# Patient Record
Sex: Female | Born: 1960 | Race: White | Hispanic: No | Marital: Married | Smoking: Never smoker
Health system: Southern US, Community
[De-identification: ages and names within clinical notes are randomized; demographics above are authoritative.]

## PROBLEM LIST (undated history)

## (undated) ENCOUNTER — Emergency Department (HOSPITAL_COMMUNITY): Admission: EM | Payer: PRIVATE HEALTH INSURANCE | Source: Home / Self Care

## (undated) DIAGNOSIS — Z5189 Encounter for other specified aftercare: Secondary | ICD-10-CM

## (undated) DIAGNOSIS — D649 Anemia, unspecified: Secondary | ICD-10-CM

## (undated) DIAGNOSIS — IMO0001 Reserved for inherently not codable concepts without codable children: Secondary | ICD-10-CM

## (undated) DIAGNOSIS — F329 Major depressive disorder, single episode, unspecified: Secondary | ICD-10-CM

## (undated) DIAGNOSIS — F419 Anxiety disorder, unspecified: Secondary | ICD-10-CM

## (undated) DIAGNOSIS — F32A Depression, unspecified: Secondary | ICD-10-CM

## (undated) HISTORY — PX: BREAST BIOPSY: SHX20

---

## 2009-10-20 ENCOUNTER — Other Ambulatory Visit: Admission: RE | Admit: 2009-10-20 | Discharge: 2009-10-20 | Payer: Self-pay | Admitting: Family Medicine

## 2009-11-03 ENCOUNTER — Encounter: Admission: RE | Admit: 2009-11-03 | Discharge: 2009-11-03 | Payer: Self-pay | Admitting: Family Medicine

## 2011-01-01 ENCOUNTER — Emergency Department (HOSPITAL_COMMUNITY)
Admission: EM | Admit: 2011-01-01 | Discharge: 2011-01-02 | Disposition: A | Discharge status: Other Acute Inpatient Hospital | Payer: 59 | Attending: Emergency Medicine | Admitting: Emergency Medicine

## 2011-01-01 DIAGNOSIS — R45851 Suicidal ideations: Secondary | ICD-10-CM | POA: Insufficient documentation

## 2011-01-01 DIAGNOSIS — F329 Major depressive disorder, single episode, unspecified: Secondary | ICD-10-CM | POA: Insufficient documentation

## 2011-01-01 DIAGNOSIS — F3289 Other specified depressive episodes: Secondary | ICD-10-CM | POA: Insufficient documentation

## 2011-01-01 LAB — RAPID URINE DRUG SCREEN, HOSP PERFORMED
Barbiturates: NOT DETECTED
Benzodiazepines: NOT DETECTED
Cocaine: NOT DETECTED
Tetrahydrocannabinol: NOT DETECTED

## 2011-01-01 LAB — DIFFERENTIAL
Basophils Absolute: 0.1 10*3/uL (ref 0.0–0.1)
Basophils Relative: 1 % (ref 0–1)
Eosinophils Absolute: 0.1 10*3/uL (ref 0.0–0.7)
Eosinophils Relative: 1 % (ref 0–5)
Lymphocytes Relative: 15 % (ref 12–46)
Lymphs Abs: 0.8 10*3/uL (ref 0.7–4.0)
Neutrophils Relative %: 73 % (ref 43–77)

## 2011-01-01 LAB — COMPREHENSIVE METABOLIC PANEL
Albumin: 4.2 g/dL (ref 3.5–5.2)
Alkaline Phosphatase: 62 U/L (ref 39–117)
BUN: 16 mg/dL (ref 6–23)
Calcium: 10.4 mg/dL (ref 8.4–10.5)
Potassium: 3.5 mEq/L (ref 3.5–5.1)
Sodium: 140 mEq/L (ref 135–145)
Total Protein: 8.1 g/dL (ref 6.0–8.3)

## 2011-01-01 LAB — CBC
HCT: 39.5 % (ref 36.0–46.0)
MCHC: 31.6 g/dL (ref 30.0–36.0)
MCV: 88 fL (ref 78.0–100.0)
RDW: 13.1 % (ref 11.5–15.5)

## 2011-01-02 ENCOUNTER — Inpatient Hospital Stay (HOSPITAL_COMMUNITY)
Admission: AD | Admit: 2011-01-02 | Discharge: 2011-01-23 | DRG: 885 | Disposition: A | Discharge status: Home / Self Care | Payer: PRIVATE HEALTH INSURANCE | Source: Ambulatory Visit | Attending: Psychiatry | Admitting: Psychiatry

## 2011-01-02 DIAGNOSIS — R109 Unspecified abdominal pain: Secondary | ICD-10-CM

## 2011-01-02 DIAGNOSIS — R209 Unspecified disturbances of skin sensation: Secondary | ICD-10-CM

## 2011-01-02 DIAGNOSIS — R5381 Other malaise: Secondary | ICD-10-CM

## 2011-01-02 DIAGNOSIS — R45851 Suicidal ideations: Secondary | ICD-10-CM

## 2011-01-02 DIAGNOSIS — F3160 Bipolar disorder, current episode mixed, unspecified: Principal | ICD-10-CM

## 2011-01-02 DIAGNOSIS — Z880 Allergy status to penicillin: Secondary | ICD-10-CM

## 2011-01-02 DIAGNOSIS — G4700 Insomnia, unspecified: Secondary | ICD-10-CM

## 2011-01-02 DIAGNOSIS — Z818 Family history of other mental and behavioral disorders: Secondary | ICD-10-CM

## 2011-01-02 DIAGNOSIS — Z560 Unemployment, unspecified: Secondary | ICD-10-CM

## 2011-01-02 DIAGNOSIS — R5383 Other fatigue: Secondary | ICD-10-CM

## 2011-01-03 DIAGNOSIS — F312 Bipolar disorder, current episode manic severe with psychotic features: Secondary | ICD-10-CM

## 2011-01-04 LAB — VITAMIN B12: Vitamin B-12: 1249 pg/mL — ABNORMAL HIGH (ref 211–911)

## 2011-01-04 NOTE — H&P (Signed)
NAME:  Chloe Dennis, Chloe Dennis NO.:  [id number]  MEDICAL RECORD NO.:  [id number]           PATIENT TYPE:  I  LOCATION:                                FACILITY:  [facility]  PHYSICIAN:  Eulogio Ditch, MD DATE OF BIRTH:  08-Jun-1961  DATE OF ADMISSION:  01/02/2011 DATE OF DISCHARGE:                      PSYCHIATRIC ADMISSION ASSESSMENT   IDENTIFYING INFORMATION:  This is a 50 year old female voluntarily admitted on January 02, 2011.  HISTORY OF PRESENT ILLNESS:  The patient reports symptoms of insomnia, experiencing this for months.  Her medications have not been working. She states that she is unable to think, that she is feeling numb physically.  She had a loss of appetite to about 15 pounds.  She states that she has been irritable and has been yelling at her dog.  She denies any psychotic symptoms.  Denies any substance use, feeling depressed and had some passive suicidal thoughts.  The patient denies any significant stressors.  PAST PSYCHIATRIC HISTORY:  First admission to the [facility].  She was hospitalized in [facility] back in 2011 for depression.  She has a history of a suicide attempt in October 2011 by overdosing.  She sees Retail buyer at [employer] in [place].  SOCIAL HISTORY:  The patient is married.  She is unemployed.  She lives with her husband.  She has a 57 year old son.  The patient is one of six sisters.  FAMILY HISTORY:  Mother she reports had a history of pregnancy psychosis.  Her older sister has a history of schizophrenia and is currently in assisted living.  ALCOHOL/DRUG HISTORY:  She denies any alcohol or substance use.  Primary care provider is Dr. Hyacinth Meeker.  MEDICAL PROBLEMS:  Denies any acute or chronic health issues.  MEDICATIONS:  Has been on Seroquel XR 600 mg, which she states is not effective.  In the past has been on Klonopin, Restoril, Remeron, lithium and Abilify.  DRUG ALLERGIES:  Penicillin with a side  effect with hives.  PHYSICAL EXAM:  GENERAL APPEARANCE:  The patient was assessed in the emergency department.  Physical exam was reviewed.  The patient was noted to be tachycardic and uncomfortable.  Physical exam overall, however, was no significant findings.  LABORATORY DATA:  Her CBC is within normal limits.  Her urine drug screen is negative.  Her CMP is negative.  Alcohol level was less than 5.  MENTAL STATUS EXAM:  The patient is fully alert and cooperative with fair eye contact.  She is casually dressed.  Her speech is clear, normal pace and tone.  Provides a fairly good history of her symptoms that prompted the admission.  She feels she needs to be here and asking for any kind of help that can get her sleeping.  Her thought processes overall are coherent and goal directed.  No evidence of any psychotic symptoms and does not appear internally distracted or guarded. Cognitive function intact.  She is aware of herself, place and situation.  ASSESSMENT:  AXIS I:  Bipolar disorder, depressed. AXIS II:  Deferred. AXIS III:  No known medical conditions. AXIS IV:  Other psychosocial problems  related to burden of illness. AXIS V:  Current is 30, 35.  PLAN:  Our plan is to discontinue the Seroquel.  We will have Ambien available for sleep as the patient reports that she slept in the emergency department on Ambien.  We will continue to address other comorbidities.  We will contact husband for concerns and safety issues. We will have nutritional supplements available and check further labs, check a TSH and B12 level.  Her tentative length of stay at this time is 3-4 days.     Landry Corporal, N.P.   ______________________________ Eulogio Ditch, MD    JO/MEDQ  D:  01/03/2011  T:  01/03/2011  Job:  [id number]  cc:   Eulogio Ditch, MD  Electronically Signed by Limmie PatriciaP. on 01/03/2011 01:13:13 PM Electronically Signed by Eulogio Ditch  on 01/04/2011  07:15:54 AM

## 2011-01-10 ENCOUNTER — Ambulatory Visit (HOSPITAL_COMMUNITY): Payer: 59 | Attending: Psychiatry

## 2011-01-10 DIAGNOSIS — R110 Nausea: Secondary | ICD-10-CM | POA: Insufficient documentation

## 2011-01-10 DIAGNOSIS — R141 Gas pain: Secondary | ICD-10-CM | POA: Insufficient documentation

## 2011-01-10 DIAGNOSIS — R142 Eructation: Secondary | ICD-10-CM | POA: Insufficient documentation

## 2011-01-10 DIAGNOSIS — K5900 Constipation, unspecified: Secondary | ICD-10-CM | POA: Insufficient documentation

## 2011-01-10 DIAGNOSIS — R109 Unspecified abdominal pain: Secondary | ICD-10-CM | POA: Insufficient documentation

## 2011-01-10 LAB — COMPREHENSIVE METABOLIC PANEL
ALT: 26 U/L (ref 0–35)
Alkaline Phosphatase: 63 U/L (ref 39–117)
CO2: 26 mEq/L (ref 19–32)
Chloride: 102 mEq/L (ref 96–112)
GFR calc Af Amer: >60 mL/min (ref >60)
Sodium: 138 mEq/L (ref 135–145)
Total Bilirubin: 0.3 mg/dL (ref 0.3–1.2)

## 2011-01-10 LAB — LIPASE, BLOOD: Lipase: 34 U/L (ref 11–59)

## 2011-01-12 LAB — URINALYSIS, ROUTINE W REFLEX MICROSCOPIC
Glucose, UA: NEGATIVE mg/dL
Ketones, ur: NEGATIVE mg/dL
Protein, ur: NEGATIVE mg/dL
Urobilinogen, UA: 0.2 mg/dL (ref 0.0–1.0)

## 2011-01-16 LAB — PREGNANCY, URINE: Preg Test, Ur: NEGATIVE

## 2011-01-18 ENCOUNTER — Ambulatory Visit (HOSPITAL_COMMUNITY): DRG: 948 | Payer: 59 | Attending: Psychiatry

## 2011-01-18 DIAGNOSIS — R4182 Altered mental status, unspecified: Secondary | ICD-10-CM | POA: Diagnosis present

## 2011-01-18 DIAGNOSIS — F319 Bipolar disorder, unspecified: Secondary | ICD-10-CM | POA: Diagnosis present

## 2011-01-18 LAB — SEDIMENTATION RATE: Sed Rate: 33 mm/hr — ABNORMAL HIGH (ref 0–22)

## 2011-01-18 MED ORDER — GADOBENATE DIMEGLUMINE 529 MG/ML IV SOLN
10.0000 mL | Freq: Once | INTRAVENOUS | Status: AC | PRN
  Administered 2011-01-18: 10 mL via INTRAVENOUS

## 2011-01-19 LAB — ANTI-NUCLEAR AB-TITER (ANA TITER): ANA Titer 1: 1:80 {titer} — ABNORMAL HIGH

## 2011-01-19 LAB — C-REACTIVE PROTEIN: CRP: 0.5 mg/dL — ABNORMAL LOW (ref <0.6)

## 2011-01-19 NOTE — Consult Note (Signed)
NAME:  Chloe Dennis, Chloe Dennis NO.:  [id number]  MEDICAL RECORD NO.:  [id number]           PATIENT TYPE:  I  LOCATION:  0505                          FACILITY:  [facility]  PHYSICIAN:  Andreas Blower, MD       DATE OF BIRTH:  1961/09/20  DATE OF CONSULTATION: DATE OF DISCHARGE:                                CONSULTATION   REFERRING PHYSICIAN:  Marlis Edelson, DO.  CLINICAL QUESTION TO BE ANSWERED:  Generalized numbness and weakness.  HISTORY OF PRESENT ILLNESS:  [name] is a 50 year old Caucasian female with a possible history of bipolar disorder, depression, anxiety, insomnia who was admitted on January 02, 2011 for insomnia.  The patient reports that she has not been able to sleep for months prior to admission and has complained about being numb both emotionally as well as physically for few months.  She has lost about 15 pounds and was admitted to [facility] on January 02, 2011.  During the course of the hospital stay, the patient complained about abdominal pain which was thought to be secondary to constipation.  The patient was started on stool softeners and her symptoms improved.  During the course of the hospital stay, the patient continued to complain about generalized numbness.  As a result, the Hospitalist Service was consulted to help with the workup. She reports that since October 2012, she has reported that generalized numbness initially started in her legs and arms which then progressed to all over her body.  She reports she does not feel pain with injury, but feels pressure like senstation.  She denies any recent fevers or chills.  Did report that she used to have headaches. Currently denies any headaches. Denies any vision changes.  Denies any chest pain or shortness of breath.  Denies any abdominal pain.  REVIEW OF SYSTEMS:  All systems were reviewed with the patient and were positive as per HPI, otherwise all other systems are negative.  She reports  that she is anxious and because of anxiety, she often has nausea.  SOCIAL HISTORY:  The patient does not smoke, does not drink any alcohol, is not working currently.  FAMILY HISTORY:  Significant for father having stents put in.  She reports that her father had a stent in his aorta as well as his arms. Does not know if her father had heart problems.  Mother died from lung cancer, also had a pulmonary embolism.  Has an elder sister with schizophrenia, has a younger sister with bipolar disorder which is medically-managed.  PHYSICAL EXAM:  VITALS:  Temperature is 99.4, respiration 18, blood pressure is 135/101, pulse of 97. GENERAL:  The patient was awake, oriented and was lying in bed comfortably.  Appeared anxious. HEENT:  Extraocular motions are intact.  Pupils equal, round, slightly dilated, had moist mucous membranes NECK:  Supple. HEART:  Was regular with S1, S2. LUNGS:  Clear to auscultation bilaterally. ABDOMEN:  Soft, nontender, nondistended.  Positive bowel sounds. EXTREMITIES:  Patient good peripheral pulses with trace edema. NEURO:  Cranial nerves II-XII grossly intact.  Had 5/5 motor strength in upper as well as  lower extremities.  Finger-to-nose was intact bilaterally. Reports pressure like sensation to my touch.  RADIOLOGY/IMAGING:  MRI of brain per radiology was negative.  LABS:  CBC shows a white count of 5.4, hemoglobin 12.5, hematocrit 36.5, platelet count 320 and these labs were done on January 15, 2011. Electrolytes normal with a creatinine of 1.08.  Most recent labs yesterday showed a ESR of 33.  Folate is 10.7.  Urine pregnancy was negative.  CRP was 0.5.  ANA was positive.  ANA titer was 1:80 with a nucleolar pattern.  ASSESSMENT/PLAN: 1. Numbness and generalized weakness, etiology unclear.  The MRI is     done; however, I do not have the results available, but reports     by radiology as being negative. The patient's ANA is positive     but uncertain of  etiology of this nonspecific finding.  Her ESR     is mildly elevated but her CRP was low.  Her neurologic exam     does not suggest that the patient has generalized numbness as she     has intact proprioception with intact finger-to-nose.  Could     consider getting Neurology evaluation. Uncertain if some of her     medications, anxiety and insomnia are causing some of her     generalized numbness and weakness. 2. Anxiety and depression.  The patient is on medications that were     observed by Psychiatry, continue management as per Psychiatry. 3. Questionable history of bipolar disorder.  Continue medications per     Psychiatry. 4. Insomnia.  Again, management as per Psychiatry. 5. Recent abdominal pain secondary to constipation, resolved. 6. History of hypertension, resolved after losing weight.  Blood     pressure stable at this time, not on any hypertensives. 7. Antinuclear antibody is positive, a nonspecific finding.  Uncertain     if the patient has CNS vasculitis.  Her CRP is negative.  Her ESR     is mildly elevated.  We will send for ANCA for further evaluation.     Based on ANCA and the MRI results, could consider Neurology workup.  Thanks for the consult.  We will continue to follow.   Andreas Blower, MD   SR/MEDQ  D:  01/19/2011  T:  01/19/2011  Job:  [id number]  Electronically Signed by Wardell Heath Carolanne Mercier  on 01/19/2011 05:37:06 PM

## 2011-01-20 LAB — COMPREHENSIVE METABOLIC PANEL
ALT: 12 U/L (ref 0–35)
AST: 16 U/L (ref 0–37)
Alkaline Phosphatase: 62 U/L (ref 39–117)
CO2: 24 mEq/L (ref 19–32)
Calcium: 9.9 mg/dL (ref 8.4–10.5)
Potassium: 3.6 mEq/L (ref 3.5–5.1)
Sodium: 139 mEq/L (ref 135–145)
Total Protein: 7.3 g/dL (ref 6.0–8.3)

## 2011-01-20 LAB — CBC
Hemoglobin: 11.8 g/dL — ABNORMAL LOW (ref 12.0–15.0)
RBC: 4.23 MIL/uL (ref 3.87–5.11)

## 2011-01-21 DIAGNOSIS — F312 Bipolar disorder, current episode manic severe with psychotic features: Secondary | ICD-10-CM

## 2011-01-23 LAB — ANTI-NEUTROPHIL ANTIBODY

## 2011-01-23 NOTE — Consult Note (Signed)
NAME:  Chloe Dennis, Chloe Dennis NO.:  [id number]  MEDICAL RECORD NO.:  [id number]           PATIENT TYPE:  I  LOCATION:  0505                          FACILITY:  [facility]  PHYSICIAN:  Thana Farr, MD    DATE OF BIRTH:  Apr 22, 1961  DATE OF CONSULTATION:  01/22/2011 DATE OF DISCHARGE:                                CONSULTATION   HISTORY:  Chloe Dennis is a 50 year old female that was admitted to the [facility] for anxiety and depression.  The patient reports that for the past year, she has had progressive symptoms of numbness all over her body.  She reports that from her head to her toes, she feels as if she is numb.  This is on both sides of her body.  She reports she can feel the pressure of somebody touching her, but does not feel the sensation.  She has not had any bowel or bladder incontinence. She does not report any muscle weakness.  PAST MEDICAL HISTORY:  Anxiety, depression.  MEDICATIONS PRIOR TO ADMISSION:  Seroquel.  SOCIAL HISTORY:  The patient has no history of alcohol, tobacco, or illicit drug abuse.  She has not worked in 3 years.  PHYSICAL EXAMINATION:  VITAL SIGNS:  Blood pressure 129/83, heart rate 94, respiratory rate 68, temperature 99.7.  MENTAL STATUS TESTING:  The patient is alert and oriented.  She can follow commands without difficulty.  Speech is fluent.  On cranial nerve testing, II--disks flat bilaterally.  Visual fields grossly intact.  III, IV, VI--extraocular movements intact.  V and VII--smile is symmetric.  VIII--grossly intact. IX and X--positive gag.  XI--bilateral shoulder shrug.  XII--midline tongue extension.  On motor exam, the patient is 5/5 throughout.  There is no drift.  There is normal tone and bulk.  On sensory testing, the patient reports that she has an unusual sensation throughout her entire body as I touch her. Vibratory sense is intact throughout.  Deep tendon reflexes are 2+ throughout.  Plantars are mute  bilaterally.  On cerebellar testing, finger-to-nose and heel-to-shin intact.  Gait normal.  ASSESSMENT:  Ms. Reder is a 50 year old female that presents with paresthesias throughout.  There is no focal nature to her symptoms. There are no other associated symptoms.  Her examination is nonfocal. She has had an MRI of the brain and this has been reviewed.  It is unremarkable.  B12 has been checked and it is over 1200.  TSH is normal as well.  Erythrocyte sedimentation rate is 33 and ANA is positive.  At this point, there is no reason to believe that there is any neurologic basis for her symptoms.  Although her ANA is positive, it is not the cause of her paresthesias.  I do suspect that her paresthesias are secondary to her anxiety.  PLAN:  No further neurologic workup recommended at this time.  Agree with continued psychiatric support.  Neurology will sign off on this case at this time.  Please feel free to call if any further neurological input is required.          ______________________________ Thana Farr, MD  LR/MEDQ  D:  01/22/2011  T:  01/23/2011  Job:  [id number]  Electronically Signed by Thana Farr MD on 01/23/2011 06:47:27 PM

## 2011-02-10 NOTE — Discharge Summary (Signed)
NAME:  Chloe Dennis, Chloe Dennis NO.:  [id number]  MEDICAL RECORD NO.:  [id number]           PATIENT TYPE:  I  LOCATION:  0505                          FACILITY:  [facility]  PHYSICIAN:  Eulogio Ditch, MD DATE OF BIRTH:  1961-01-01  DATE OF ADMISSION:  01/02/2011 DATE OF DISCHARGE:  01/23/2011                              DISCHARGE SUMMARY   REASON FOR ADMISSION:  This is a 50 year old female who was endorsing suicidal thoughts, reporting decreased sleep for months, feeling like she could not think, feeling very numb physically, has lost about 15 pounds.  FINAL DIAGNOSES:  Axis I:  Bipolar disorder, depressed. Axis II:  Deferred. Axis III:  No known medical conditions. Axis IV:  Psychosocial problems related to burden of illness. Axis V:  50.  SIGNIFICANT LABS:  Her CBC was within normal limits.  Urine drug screen was negative.  CMP was negative.  Alcohol level was less than 5.  SIGNIFICANT FINDINGS:  This is a fully alert and cooperative female. Fair eye contact.  Casually dressed.  Speech is clear with normal pace and tone.  She provides a good history of her symptoms that prompted her admission.  She feels that she needs to be here and is asking for any kind of help that she can get to help her sleep.  Her thought processes overall are coherent and goal-directed.  She does not appear to be internally distracted or guarded and is oriented to person, time and place.  We discontinued her Seroquel and had Ambien available for sleep, as she stated that she slept well in the emergency department on Ambien. We continued to address her other comorbidities and contacted her husband for concerns and safety issues.  We obtained a TSH and B12 level.  We discontinued her Risperdal and started Zyprexa and added Prozac daily.  Patient reporting sleeping only 1-1/2 hours.  We contacted the patient's husband for information and to provide education.  The patient had to be placed on  a one-to-one, as she was reporting suicidal ideation and forming plans and being unable to contract for safety.  She was feeling very hopeless.  She remained cooperative but continued with her one-to-one.  Endorsing symptoms of feeling numb and anxious and not being able to sleep at great length. She denied any suicidal or homicidal thoughts, reporting symptoms of anxiety and stating that she had slept well for the first time but continuing to endorse symptoms of feeling numb both emotionally and physically.  She was taking care of her hygiene, showering, brushing her teeth.  Her appetite was improving some.  Her affect remained blunted to labile.  She denied any auditory hallucinations.  We added Remeron for sleep.  She was making some improvement, less anxious, but having suicidal thoughts without a specific plan, having a lot of somatic complaints like numbness in the face and in her arms.  She was, however, logical and goal-directed.  We had Internal Medicine come to do a consult on the patient with patient's continuing complaints of numbness and with her ongoing somatic complaints.  We continued with her Seroquel and Prozac and  Remeron and trazodone and Ambien for sleep.  The patient continued with ongoing symptoms of anxiety.  She was having a lot of negative thinking, always feeling like something was wrong and endorsing symptoms of insomnia.  The patient continued to endorse, again, that she was not sleeping.  We were having nursing as well monitoring the patient's insomnia.  We had done further lab work.  We discontinued her Restoril and added Sonata for insomnia and Lamictal for mood stability. She had moments when she would get very tearful and anxious, talking about feeling weaker and not sleeping.  We did a neurological consult, as the patient had a positive ANA.  Her MRI of the brain was unremarkable and coupled with the patient's physical complaints, they felt that her  symptoms were likely related to anxiety.  We contacted the patient's husband to discuss discharge safety concerns.  The patient had improved to the point where she was safe and denied any suicidal thoughts.  We contacted her husband who was agreeable to take the patient home.  We had gotten an appointment with her primary care provider for further recommendations, and the patient was discharged to home.  DISCHARGE MEDICATIONS: 1. Colace 100 mg by mouth daily. 2. Lamictal 25 mg 1 tablet daily. 3. MiraLax 17 grams daily. 4. Ambien 10 mg q.h.s. for sleep. 5. The patient was to stop taking her Seroquel.  FOLLOWUP APPOINTMENTS: 1. With Anne Fu on Monday, April 9th at 11:20. 2. She had an appointment with her primary care provider in April 5th     at 11:00 a.m.  Phone number [phone], Dr. Sigmund Hazel.     Landry Corporal, N.P.   ______________________________ Eulogio Ditch, MD    JO/MEDQ  D:  02/07/2011  T:  02/07/2011  Job:  [id number]  Electronically Signed by Limmie PatriciaP. on 02/08/2011 10:53:54 AM Electronically Signed by Eulogio Ditch  on 02/10/2011 05:37:09 AM

## 2011-02-16 ENCOUNTER — Other Ambulatory Visit (HOSPITAL_COMMUNITY): Payer: Self-pay | Admitting: Unknown Physician Specialty

## 2011-02-16 DIAGNOSIS — M542 Cervicalgia: Secondary | ICD-10-CM

## 2011-02-21 ENCOUNTER — Ambulatory Visit (HOSPITAL_COMMUNITY)
Admission: RE | Admit: 2011-02-21 | Discharge: 2011-02-21 | Disposition: A | Discharge status: Home / Self Care | Payer: 59 | Source: Ambulatory Visit | Attending: Unknown Physician Specialty | Admitting: Unknown Physician Specialty

## 2011-02-21 DIAGNOSIS — R209 Unspecified disturbances of skin sensation: Secondary | ICD-10-CM | POA: Insufficient documentation

## 2011-02-21 DIAGNOSIS — E049 Nontoxic goiter, unspecified: Secondary | ICD-10-CM | POA: Insufficient documentation

## 2011-02-21 DIAGNOSIS — M542 Cervicalgia: Secondary | ICD-10-CM

## 2011-02-21 DIAGNOSIS — M47812 Spondylosis without myelopathy or radiculopathy, cervical region: Secondary | ICD-10-CM | POA: Insufficient documentation

## 2011-02-21 DIAGNOSIS — M79609 Pain in unspecified limb: Secondary | ICD-10-CM | POA: Insufficient documentation

## 2011-02-27 ENCOUNTER — Ambulatory Visit: Payer: 59 | Admitting: Hematology & Oncology

## 2011-03-13 ENCOUNTER — Other Ambulatory Visit: Payer: Self-pay | Admitting: Family Medicine

## 2011-03-13 DIAGNOSIS — E041 Nontoxic single thyroid nodule: Secondary | ICD-10-CM

## 2011-03-15 ENCOUNTER — Ambulatory Visit
Admission: RE | Admit: 2011-03-15 | Discharge: 2011-03-15 | Disposition: A | Discharge status: Home / Self Care | Payer: 59 | Source: Ambulatory Visit | Attending: Family Medicine | Admitting: Family Medicine

## 2011-03-15 DIAGNOSIS — E041 Nontoxic single thyroid nodule: Secondary | ICD-10-CM

## 2011-03-16 ENCOUNTER — Other Ambulatory Visit: Payer: Self-pay | Admitting: Hematology & Oncology

## 2011-03-16 ENCOUNTER — Ambulatory Visit (HOSPITAL_BASED_OUTPATIENT_CLINIC_OR_DEPARTMENT_OTHER): Payer: 59 | Admitting: Hematology & Oncology

## 2011-03-16 DIAGNOSIS — D649 Anemia, unspecified: Secondary | ICD-10-CM

## 2011-03-16 DIAGNOSIS — R634 Abnormal weight loss: Secondary | ICD-10-CM

## 2011-03-16 DIAGNOSIS — R209 Unspecified disturbances of skin sensation: Secondary | ICD-10-CM

## 2011-03-16 LAB — CBC WITH DIFFERENTIAL (CANCER CENTER ONLY)
BASO#: 0 10*3/uL (ref 0.0–0.2)
Eosinophils Absolute: 0.2 10*3/uL (ref 0.0–0.5)
HGB: 11.5 g/dL — ABNORMAL LOW (ref 11.6–15.9)
MCH: 27.7 pg (ref 26.0–34.0)
MCV: 84 fL (ref 81–101)
MONO#: 0.6 10*3/uL (ref 0.1–0.9)
MONO%: 8.9 % (ref 0.0–13.0)
NEUT#: 4.3 10*3/uL (ref 1.5–6.5)
RBC: 4.15 10*6/uL (ref 3.70–5.32)
WBC: 6.3 10*3/uL (ref 3.9–10.0)

## 2011-03-16 LAB — CERULOPLASMIN: Ceruloplasmin: 33 mg/dL (ref 21–63)

## 2011-03-20 ENCOUNTER — Other Ambulatory Visit: Payer: Self-pay | Admitting: Hematology & Oncology

## 2011-03-21 LAB — IRON AND TIBC
%SAT: 9 % — ABNORMAL LOW (ref 20–55)
TIBC: 388 ug/dL (ref 250–470)

## 2011-03-21 LAB — PROTEIN ELECTROPHORESIS, SERUM
Beta Globulin: 6.6 % (ref 4.7–7.2)
Total Protein, Serum Electrophoresis: 7.1 g/dL (ref 6.0–8.3)

## 2011-03-21 LAB — VITAMIN D 25 HYDROXY (VIT D DEFICIENCY, FRACTURES): Vit D, 25-Hydroxy: 36 ng/mL (ref 30–89)

## 2011-03-21 LAB — LACTATE DEHYDROGENASE: LDH: 137 U/L (ref 94–250)

## 2011-03-21 LAB — VITAMIN B12: Vitamin B-12: 1779 pg/mL — ABNORMAL HIGH (ref 211–911)

## 2011-03-21 LAB — FERRITIN: Ferritin: 6 ng/mL — ABNORMAL LOW (ref 10–291)

## 2011-03-23 ENCOUNTER — Other Ambulatory Visit: Payer: Self-pay | Admitting: Family Medicine

## 2011-03-23 DIAGNOSIS — E042 Nontoxic multinodular goiter: Secondary | ICD-10-CM

## 2011-03-23 LAB — UIFE/LIGHT CHAINS/TP QN, 24-HR UR
Albumin, U: DETECTED
Free Lambda Lt Chains,Ur: <0.04 mg/dL — ABNORMAL LOW (ref 0.08–1.01)
Free Lt Chn Excr Rate: 10.59 mg/d
Total Protein, Urine-Ur/day: 17 mg/d (ref 10–140)
Total Protein, Urine: 1.2 mg/dL
Volume, Urine: 1450 mL

## 2011-04-03 ENCOUNTER — Encounter (HOSPITAL_BASED_OUTPATIENT_CLINIC_OR_DEPARTMENT_OTHER): Payer: 59 | Admitting: Hematology & Oncology

## 2011-04-03 DIAGNOSIS — D649 Anemia, unspecified: Secondary | ICD-10-CM

## 2011-04-10 ENCOUNTER — Other Ambulatory Visit: Payer: Self-pay | Admitting: Interventional Radiology

## 2011-04-10 ENCOUNTER — Other Ambulatory Visit (HOSPITAL_COMMUNITY)
Admission: RE | Admit: 2011-04-10 | Discharge: 2011-04-10 | Disposition: A | Discharge status: Home / Self Care | Payer: 59 | Source: Ambulatory Visit | Attending: Interventional Radiology | Admitting: Interventional Radiology

## 2011-04-10 ENCOUNTER — Ambulatory Visit
Admission: RE | Admit: 2011-04-10 | Discharge: 2011-04-10 | Disposition: A | Discharge status: Home / Self Care | Payer: 59 | Source: Ambulatory Visit | Attending: Family Medicine | Admitting: Family Medicine

## 2011-04-10 DIAGNOSIS — E042 Nontoxic multinodular goiter: Secondary | ICD-10-CM

## 2011-04-10 DIAGNOSIS — E049 Nontoxic goiter, unspecified: Secondary | ICD-10-CM | POA: Insufficient documentation

## 2011-04-11 ENCOUNTER — Other Ambulatory Visit: Payer: 59

## 2011-05-15 ENCOUNTER — Other Ambulatory Visit: Payer: Self-pay | Admitting: Hematology & Oncology

## 2011-05-15 ENCOUNTER — Encounter (HOSPITAL_BASED_OUTPATIENT_CLINIC_OR_DEPARTMENT_OTHER): Payer: 59 | Admitting: Hematology & Oncology

## 2011-05-15 DIAGNOSIS — R634 Abnormal weight loss: Secondary | ICD-10-CM

## 2011-05-15 DIAGNOSIS — R209 Unspecified disturbances of skin sensation: Secondary | ICD-10-CM

## 2011-05-15 DIAGNOSIS — D649 Anemia, unspecified: Secondary | ICD-10-CM

## 2011-05-15 LAB — CBC WITH DIFFERENTIAL (CANCER CENTER ONLY)
BASO#: 0 10*3/uL (ref 0.0–0.2)
EOS%: 2.2 % (ref 0.0–7.0)
Eosinophils Absolute: 0.1 10*3/uL (ref 0.0–0.5)
HGB: 13 g/dL (ref 11.6–15.9)
LYMPH#: 1.1 10*3/uL (ref 0.9–3.3)
MCHC: 34.9 g/dL (ref 32.0–36.0)
NEUT#: 4.2 10*3/uL (ref 1.5–6.5)
RBC: 4.33 10*6/uL (ref 3.70–5.32)
WBC: 6 10*3/uL (ref 3.9–10.0)

## 2011-05-19 LAB — RETICULOCYTES (CHCC)
ABS Retic: 53.3 10*3/uL (ref 19.0–186.0)
RBC.: 4.44 MIL/uL (ref 3.87–5.11)
Retic Ct Pct: 1.2 % (ref 0.4–2.3)

## 2011-05-19 LAB — IRON AND TIBC: Iron: 124 ug/dL (ref 42–145)

## 2011-05-19 LAB — FERRITIN: Ferritin: 314 ng/mL — ABNORMAL HIGH (ref 10–291)

## 2011-06-20 DIAGNOSIS — F319 Bipolar disorder, unspecified: Secondary | ICD-10-CM | POA: Insufficient documentation

## 2011-06-30 ENCOUNTER — Emergency Department (HOSPITAL_COMMUNITY)
Admission: EM | Admit: 2011-06-30 | Discharge: 2011-06-30 | Disposition: A | Discharge status: Home / Self Care | Payer: PRIVATE HEALTH INSURANCE | Attending: Emergency Medicine | Admitting: Emergency Medicine

## 2011-06-30 ENCOUNTER — Encounter (HOSPITAL_COMMUNITY): Payer: Self-pay | Admitting: Radiology

## 2011-06-30 ENCOUNTER — Emergency Department (HOSPITAL_COMMUNITY): Payer: PRIVATE HEALTH INSURANCE

## 2011-06-30 DIAGNOSIS — R109 Unspecified abdominal pain: Secondary | ICD-10-CM | POA: Insufficient documentation

## 2011-06-30 DIAGNOSIS — D259 Leiomyoma of uterus, unspecified: Secondary | ICD-10-CM | POA: Insufficient documentation

## 2011-06-30 LAB — URINALYSIS, ROUTINE W REFLEX MICROSCOPIC
Glucose, UA: NEGATIVE mg/dL
Leukocytes, UA: NEGATIVE
Specific Gravity, Urine: 1.009 (ref 1.005–1.030)

## 2011-06-30 LAB — URINE MICROSCOPIC-ADD ON: Urine-Other: NONE SEEN

## 2011-06-30 LAB — CBC
MCH: 31.6 pg (ref 26.0–34.0)
MCHC: 34.1 g/dL (ref 30.0–36.0)
MCV: 92.6 fL (ref 78.0–100.0)
Platelets: 285 10*3/uL (ref 150–400)
RDW: 15.3 % (ref 11.5–15.5)

## 2011-06-30 LAB — DIFFERENTIAL
Basophils Relative: 1 % (ref 0–1)
Eosinophils Absolute: 0.1 10*3/uL (ref 0.0–0.7)
Eosinophils Relative: 2 % (ref 0–5)
Lymphs Abs: 1 10*3/uL (ref 0.7–4.0)
Monocytes Absolute: 0.7 10*3/uL (ref 0.1–1.0)
Monocytes Relative: 11 % (ref 3–12)
Neutrophils Relative %: 71 % (ref 43–77)

## 2011-06-30 LAB — COMPREHENSIVE METABOLIC PANEL
ALT: 19 U/L (ref 0–35)
AST: 21 U/L (ref 0–37)
CO2: 29 mEq/L (ref 19–32)
Calcium: 10.4 mg/dL (ref 8.4–10.5)
Creatinine, Ser: 0.84 mg/dL (ref 0.50–1.10)
GFR calc non Af Amer: >60 mL/min (ref >60)
Sodium: 139 mEq/L (ref 135–145)
Total Protein: 7.6 g/dL (ref 6.0–8.3)

## 2011-06-30 MED ORDER — IOHEXOL 300 MG/ML  SOLN
100.0000 mL | Freq: Once | INTRAMUSCULAR | Status: AC | PRN
  Administered 2011-06-30: 100 mL via INTRAVENOUS

## 2011-07-11 ENCOUNTER — Other Ambulatory Visit (HOSPITAL_COMMUNITY): Payer: Self-pay | Admitting: Gastroenterology

## 2011-07-11 DIAGNOSIS — R112 Nausea with vomiting, unspecified: Secondary | ICD-10-CM

## 2011-07-27 ENCOUNTER — Encounter (HOSPITAL_COMMUNITY)
Admission: RE | Admit: 2011-07-27 | Discharge: 2011-07-27 | Disposition: A | Discharge status: Home / Self Care | Payer: PRIVATE HEALTH INSURANCE | Source: Ambulatory Visit | Attending: Gastroenterology | Admitting: Gastroenterology

## 2011-07-27 DIAGNOSIS — R109 Unspecified abdominal pain: Secondary | ICD-10-CM | POA: Insufficient documentation

## 2011-07-27 DIAGNOSIS — R112 Nausea with vomiting, unspecified: Secondary | ICD-10-CM | POA: Insufficient documentation

## 2011-07-27 MED ORDER — SINCALIDE 5 MCG IJ SOLR: 0.0200 ug/kg | Freq: Once | INTRAMUSCULAR | Status: DC

## 2011-07-27 MED ORDER — TECHNETIUM TC 99M MEBROFENIN IV KIT
5.3000 | PACK | Freq: Once | INTRAVENOUS | Status: AC | PRN
  Administered 2011-07-27: 5.3 via INTRAVENOUS

## 2011-08-08 ENCOUNTER — Other Ambulatory Visit (HOSPITAL_COMMUNITY)
Admission: RE | Admit: 2011-08-08 | Discharge: 2011-08-08 | Disposition: A | Discharge status: Home / Self Care | Payer: PRIVATE HEALTH INSURANCE | Source: Ambulatory Visit | Attending: Obstetrics and Gynecology | Admitting: Obstetrics and Gynecology

## 2011-08-08 ENCOUNTER — Other Ambulatory Visit: Payer: Self-pay | Admitting: Obstetrics and Gynecology

## 2011-08-08 DIAGNOSIS — Z01419 Encounter for gynecological examination (general) (routine) without abnormal findings: Secondary | ICD-10-CM | POA: Insufficient documentation

## 2011-08-23 HISTORY — PX: UPPER GASTROINTESTINAL ENDOSCOPY: SHX188

## 2011-08-23 HISTORY — PX: OTHER SURGICAL HISTORY: SHX169

## 2011-08-24 ENCOUNTER — Other Ambulatory Visit: Payer: Self-pay | Admitting: Gastroenterology

## 2011-08-30 ENCOUNTER — Encounter (HOSPITAL_COMMUNITY): Payer: Self-pay | Admitting: Pharmacist

## 2011-08-31 ENCOUNTER — Encounter (HOSPITAL_COMMUNITY)
Admission: RE | Admit: 2011-08-31 | Discharge: 2011-08-31 | Disposition: A | Discharge status: Home / Self Care | Payer: PRIVATE HEALTH INSURANCE | Source: Ambulatory Visit | Attending: Obstetrics and Gynecology | Admitting: Obstetrics and Gynecology

## 2011-08-31 ENCOUNTER — Encounter (HOSPITAL_COMMUNITY): Payer: Self-pay

## 2011-08-31 HISTORY — DX: Anemia, unspecified: D64.9

## 2011-08-31 HISTORY — DX: Anxiety disorder, unspecified: F41.9

## 2011-08-31 HISTORY — DX: Major depressive disorder, single episode, unspecified: F32.9

## 2011-08-31 HISTORY — DX: Depression, unspecified: F32.A

## 2011-08-31 HISTORY — DX: Encounter for other specified aftercare: Z51.89

## 2011-08-31 HISTORY — DX: Reserved for inherently not codable concepts without codable children: IMO0001

## 2011-08-31 LAB — CBC
HCT: 43.5 % (ref 36.0–46.0)
Hemoglobin: 14.7 g/dL (ref 12.0–15.0)
MCH: 32 pg (ref 26.0–34.0)
MCHC: 33.8 g/dL (ref 30.0–36.0)
MCV: 94.8 fL (ref 78.0–100.0)
Platelets: 286 K/uL (ref 150–400)
RBC: 4.59 MIL/uL (ref 3.87–5.11)
RDW: 12.1 % (ref 11.5–15.5)
WBC: 5.4 K/uL (ref 4.0–10.5)

## 2011-08-31 LAB — SURGICAL PCR SCREEN
MRSA, PCR: NEGATIVE
Staphylococcus aureus: NEGATIVE

## 2011-08-31 NOTE — Patient Instructions (Addendum)
   Your procedure is scheduled on:  Wednesday, Nov 14th  Enter through the [employer] [facility] at: 12:30pm Pick up the phone at the desk and dial [phone] and inform us of your arrival.  Please call this number if you have any problems the morning of surgery: [phone]  Remember: Do not eat food after midnight: Tuesday Do not drink clear liquids after: 10:00am Wednesday Take these medicines the morning of surgery with a SIP OF WATER: none  Do not wear jewelry, make-up, or FINGER nail polish Do not wear lotions, powders, or perfumes.  You may not  wear deodorant. Do not shave 48 hours prior to surgery. Do not bring valuables to the hospital.  Leave suitcase in the car. After Surgery it may be brought to your room. For patients being admitted to the hospital, checkout time is 11:00am the day of discharge.  Home with Husband "Annette Stable"  [place]  cell  [phone]   Remember to use your hibiclens as instructed.Please shower with 1/2 bottle the evening before your surgery and the other 1/2 bottle the morning of surgery.

## 2011-08-31 NOTE — Pre-Procedure Instructions (Signed)
Ok to see patient DOS. 

## 2011-09-04 ENCOUNTER — Other Ambulatory Visit: Payer: Self-pay | Admitting: Obstetrics and Gynecology

## 2011-09-05 ENCOUNTER — Encounter (HOSPITAL_COMMUNITY): Payer: Self-pay | Admitting: *Deleted

## 2011-09-05 ENCOUNTER — Other Ambulatory Visit: Payer: Self-pay | Admitting: Obstetrics and Gynecology

## 2011-09-05 ENCOUNTER — Encounter (HOSPITAL_COMMUNITY)
Admission: RE | Disposition: A | Discharge status: Home / Self Care | Payer: Self-pay | Source: Ambulatory Visit | Attending: Obstetrics and Gynecology

## 2011-09-05 ENCOUNTER — Inpatient Hospital Stay (HOSPITAL_COMMUNITY)
Admission: RE | Admit: 2011-09-05 | Discharge: 2011-09-08 | DRG: 742 | Disposition: A | Discharge status: Home / Self Care | Payer: PRIVATE HEALTH INSURANCE | Source: Ambulatory Visit | Attending: Obstetrics and Gynecology | Admitting: Obstetrics and Gynecology

## 2011-09-05 ENCOUNTER — Encounter (HOSPITAL_COMMUNITY): Payer: Self-pay | Admitting: Anesthesiology

## 2011-09-05 ENCOUNTER — Inpatient Hospital Stay (HOSPITAL_COMMUNITY): Payer: PRIVATE HEALTH INSURANCE | Admitting: Anesthesiology

## 2011-09-05 DIAGNOSIS — Z90710 Acquired absence of both cervix and uterus: Secondary | ICD-10-CM

## 2011-09-05 DIAGNOSIS — F319 Bipolar disorder, unspecified: Secondary | ICD-10-CM | POA: Diagnosis present

## 2011-09-05 DIAGNOSIS — D251 Intramural leiomyoma of uterus: Principal | ICD-10-CM | POA: Diagnosis present

## 2011-09-05 DIAGNOSIS — F411 Generalized anxiety disorder: Secondary | ICD-10-CM | POA: Diagnosis present

## 2011-09-05 DIAGNOSIS — D252 Subserosal leiomyoma of uterus: Secondary | ICD-10-CM | POA: Diagnosis present

## 2011-09-05 DIAGNOSIS — R45851 Suicidal ideations: Secondary | ICD-10-CM

## 2011-09-05 DIAGNOSIS — N840 Polyp of corpus uteri: Secondary | ICD-10-CM | POA: Diagnosis present

## 2011-09-05 DIAGNOSIS — N949 Unspecified condition associated with female genital organs and menstrual cycle: Secondary | ICD-10-CM | POA: Diagnosis present

## 2011-09-05 HISTORY — PX: ABDOMINAL HYSTERECTOMY: SHX81

## 2011-09-05 LAB — TYPE AND SCREEN

## 2011-09-05 LAB — HCG, SERUM, QUALITATIVE: Preg, Serum: NEGATIVE

## 2011-09-05 SURGERY — HYSTERECTOMY, ABDOMINAL
Anesthesia: General | Site: Abdomen | Wound class: Clean Contaminated

## 2011-09-05 MED ORDER — HYDROMORPHONE HCL PF 1 MG/ML IJ SOLN
0.2500 mg | INTRAMUSCULAR | Status: DC | PRN
  Administered 2011-09-05 (×4): 0.5 mg via INTRAVENOUS

## 2011-09-05 MED ORDER — SIMETHICONE 80 MG PO CHEW
80.0000 mg | CHEWABLE_TABLET | Freq: Four times a day (QID) | ORAL | Status: DC | PRN
  Administered 2011-09-07: 80 mg via ORAL

## 2011-09-05 MED ORDER — FENTANYL CITRATE 0.05 MG/ML IJ SOLN
INTRAMUSCULAR | Status: DC | PRN
  Administered 2011-09-05 (×2): 100 ug via INTRAVENOUS
  Administered 2011-09-05: 50 ug via INTRAVENOUS

## 2011-09-05 MED ORDER — ROCURONIUM BROMIDE 50 MG/5ML IV SOLN
INTRAVENOUS | Status: AC
  Filled 2011-09-05: qty 1

## 2011-09-05 MED ORDER — HYDROMORPHONE HCL PF 1 MG/ML IJ SOLN
INTRAMUSCULAR | Status: AC
  Administered 2011-09-05: 0.5 mg via INTRAVENOUS
  Filled 2011-09-05: qty 1

## 2011-09-05 MED ORDER — METRONIDAZOLE IN NACL 5-0.79 MG/ML-% IV SOLN
INTRAVENOUS | Status: DC | PRN
  Administered 2011-09-05: 500 mg via INTRAVENOUS

## 2011-09-05 MED ORDER — MIDAZOLAM HCL 2 MG/2ML IJ SOLN
INTRAMUSCULAR | Status: AC
  Filled 2011-09-05: qty 2

## 2011-09-05 MED ORDER — NALOXONE HCL 0.4 MG/ML IJ SOLN: 0.4000 mg | INTRAMUSCULAR | Status: DC | PRN

## 2011-09-05 MED ORDER — FENTANYL 10 MCG/ML IV SOLN
INTRAVENOUS | Status: DC
  Administered 2011-09-05: 20:00:00 via INTRAVENOUS
  Administered 2011-09-05: 40 ug via INTRAVENOUS
  Administered 2011-09-06 (×2): 90 ug via INTRAVENOUS
  Administered 2011-09-06: 60 ug via INTRAVENOUS
  Administered 2011-09-06: 50 ug via INTRAVENOUS
  Administered 2011-09-06 (×2): 45 ug via INTRAVENOUS
  Administered 2011-09-07: 15 ug via INTRAVENOUS
  Filled 2011-09-05 (×2): qty 50

## 2011-09-05 MED ORDER — HYDROMORPHONE HCL PF 1 MG/ML IJ SOLN
INTRAMUSCULAR | Status: AC
  Filled 2011-09-05: qty 2

## 2011-09-05 MED ORDER — MIDAZOLAM HCL 5 MG/5ML IJ SOLN
INTRAMUSCULAR | Status: DC | PRN
  Administered 2011-09-05: 2 mg via INTRAVENOUS

## 2011-09-05 MED ORDER — GENTAMICIN IN SALINE 1-0.9 MG/ML-% IV SOLN
100.0000 mg | INTRAVENOUS | Status: DC
  Filled 2011-09-05: qty 100

## 2011-09-05 MED ORDER — ONDANSETRON HCL 4 MG/2ML IJ SOLN
INTRAMUSCULAR | Status: AC
  Filled 2011-09-05: qty 2

## 2011-09-05 MED ORDER — SODIUM CHLORIDE 0.9 % IJ SOLN: 9.0000 mL | INTRAMUSCULAR | Status: DC | PRN

## 2011-09-05 MED ORDER — DIPHENHYDRAMINE HCL 12.5 MG/5ML PO ELIX: 12.5000 mg | ORAL_SOLUTION | Freq: Four times a day (QID) | ORAL | Status: DC | PRN

## 2011-09-05 MED ORDER — NEOSTIGMINE METHYLSULFATE 1 MG/ML IJ SOLN
INTRAMUSCULAR | Status: DC | PRN
  Administered 2011-09-05: 2 mg via INTRAVENOUS

## 2011-09-05 MED ORDER — DEXAMETHASONE SODIUM PHOSPHATE 10 MG/ML IJ SOLN
INTRAMUSCULAR | Status: AC
  Filled 2011-09-05: qty 1

## 2011-09-05 MED ORDER — GLYCOPYRROLATE 0.2 MG/ML IJ SOLN
INTRAMUSCULAR | Status: DC | PRN
  Administered 2011-09-05: .4 mg via INTRAVENOUS

## 2011-09-05 MED ORDER — PANTOPRAZOLE SODIUM 40 MG PO TBEC
40.0000 mg | DELAYED_RELEASE_TABLET | Freq: Every day | ORAL | Status: DC
  Administered 2011-09-06 – 2011-09-07 (×2): 40 mg via ORAL
  Filled 2011-09-05 (×5): qty 1

## 2011-09-05 MED ORDER — PROPOFOL 10 MG/ML IV EMUL
INTRAVENOUS | Status: DC | PRN
  Administered 2011-09-05: 10 mg via INTRAVENOUS
  Administered 2011-09-05 (×2): 20 mg via INTRAVENOUS
  Administered 2011-09-05: 150 mg via INTRAVENOUS

## 2011-09-05 MED ORDER — HYDROMORPHONE HCL PF 1 MG/ML IJ SOLN
INTRAMUSCULAR | Status: DC | PRN
  Administered 2011-09-05: 1 mg via INTRAVENOUS

## 2011-09-05 MED ORDER — LACTATED RINGERS IV SOLN
INTRAVENOUS | Status: DC
  Administered 2011-09-05 (×3): via INTRAVENOUS

## 2011-09-05 MED ORDER — ONDANSETRON HCL 4 MG/2ML IJ SOLN: 4.0000 mg | Freq: Four times a day (QID) | INTRAMUSCULAR | Status: DC | PRN

## 2011-09-05 MED ORDER — MEPERIDINE HCL 25 MG/ML IJ SOLN: 6.2500 mg | INTRAMUSCULAR | Status: DC | PRN

## 2011-09-05 MED ORDER — IBUPROFEN 800 MG PO TABS
800.0000 mg | ORAL_TABLET | Freq: Three times a day (TID) | ORAL | Status: DC | PRN
  Administered 2011-09-07: 800 mg via ORAL
  Filled 2011-09-05: qty 1

## 2011-09-05 MED ORDER — KETOROLAC TROMETHAMINE 30 MG/ML IJ SOLN
30.0000 mg | Freq: Once | INTRAMUSCULAR | Status: DC
  Filled 2011-09-05 (×6): qty 1

## 2011-09-05 MED ORDER — LACTATED RINGERS IV SOLN
INTRAVENOUS | Status: DC
  Administered 2011-09-06 – 2011-09-07 (×4): via INTRAVENOUS

## 2011-09-05 MED ORDER — LIDOCAINE HCL (CARDIAC) 20 MG/ML IV SOLN
INTRAVENOUS | Status: DC | PRN
  Administered 2011-09-05: 50 mg via INTRAVENOUS

## 2011-09-05 MED ORDER — HYDROMORPHONE HCL PF 1 MG/ML IJ SOLN: 0.2000 mg | INTRAMUSCULAR | Status: DC | PRN

## 2011-09-05 MED ORDER — OXYCODONE-ACETAMINOPHEN 5-325 MG PO TABS
1.0000 | ORAL_TABLET | ORAL | Status: DC | PRN
  Administered 2011-09-07 (×2): 2 via ORAL
  Filled 2011-09-05 (×2): qty 2

## 2011-09-05 MED ORDER — METRONIDAZOLE IN NACL 5-0.79 MG/ML-% IV SOLN
500.0000 mg | INTRAVENOUS | Status: DC
  Filled 2011-09-05: qty 100

## 2011-09-05 MED ORDER — FENTANYL CITRATE 0.05 MG/ML IJ SOLN
INTRAMUSCULAR | Status: AC
  Filled 2011-09-05: qty 5

## 2011-09-05 MED ORDER — LIDOCAINE HCL (CARDIAC) 20 MG/ML IV SOLN
INTRAVENOUS | Status: AC
  Filled 2011-09-05: qty 5

## 2011-09-05 MED ORDER — ROCURONIUM BROMIDE 100 MG/10ML IV SOLN
INTRAVENOUS | Status: DC | PRN
  Administered 2011-09-05: 50 mg via INTRAVENOUS

## 2011-09-05 MED ORDER — GENTAMICIN IN SALINE 1.6-0.9 MG/ML-% IV SOLN
INTRAVENOUS | Status: DC | PRN
  Administered 2011-09-05: 100 mg via INTRAVENOUS

## 2011-09-05 MED ORDER — DIPHENHYDRAMINE HCL 50 MG/ML IJ SOLN
12.5000 mg | Freq: Four times a day (QID) | INTRAMUSCULAR | Status: DC | PRN
  Administered 2011-09-06: 12.5 mg via INTRAVENOUS
  Filled 2011-09-05: qty 1

## 2011-09-05 MED ORDER — METOCLOPRAMIDE HCL 5 MG/ML IJ SOLN: 10.0000 mg | Freq: Once | INTRAMUSCULAR | Status: DC | PRN

## 2011-09-05 MED ORDER — ONDANSETRON HCL 4 MG/2ML IJ SOLN
INTRAMUSCULAR | Status: DC | PRN
  Administered 2011-09-05: 4 mg via INTRAVENOUS

## 2011-09-05 MED ORDER — MENTHOL 3 MG MT LOZG: 1.0000 | LOZENGE | OROMUCOSAL | Status: DC | PRN

## 2011-09-05 MED ORDER — GLYCOPYRROLATE 0.2 MG/ML IJ SOLN
INTRAMUSCULAR | Status: AC
  Filled 2011-09-05: qty 1

## 2011-09-05 MED ORDER — ONDANSETRON HCL 4 MG PO TABS
4.0000 mg | ORAL_TABLET | Freq: Four times a day (QID) | ORAL | Status: DC | PRN
  Administered 2011-09-08: 4 mg via ORAL
  Filled 2011-09-05: qty 1

## 2011-09-05 MED ORDER — DOCUSATE SODIUM 100 MG PO CAPS
100.0000 mg | ORAL_CAPSULE | Freq: Every day | ORAL | Status: DC
  Administered 2011-09-06 – 2011-09-08 (×2): 100 mg via ORAL
  Filled 2011-09-05 (×2): qty 1

## 2011-09-05 MED ORDER — FENTANYL CITRATE 0.05 MG/ML IJ SOLN
INTRAMUSCULAR | Status: AC
  Administered 2011-09-05: 50 ug via INTRAVENOUS
  Filled 2011-09-05: qty 2

## 2011-09-05 MED ORDER — LAMOTRIGINE 25 MG PO TABS
25.0000 mg | ORAL_TABLET | Freq: Every day | ORAL | Status: DC
  Administered 2011-09-06 – 2011-09-07 (×2): 25 mg via ORAL
  Filled 2011-09-05 (×4): qty 1

## 2011-09-05 MED ORDER — NEOSTIGMINE METHYLSULFATE 1 MG/ML IJ SOLN
INTRAMUSCULAR | Status: AC
  Filled 2011-09-05: qty 10

## 2011-09-05 MED ORDER — KETOROLAC TROMETHAMINE 30 MG/ML IJ SOLN
30.0000 mg | Freq: Four times a day (QID) | INTRAMUSCULAR | Status: DC
  Administered 2011-09-05 – 2011-09-07 (×7): 30 mg via INTRAVENOUS
  Filled 2011-09-05 (×2): qty 1

## 2011-09-05 MED ORDER — FENTANYL CITRATE 0.05 MG/ML IJ SOLN
50.0000 ug | INTRAMUSCULAR | Status: DC | PRN
  Administered 2011-09-05 (×2): 50 ug via INTRAVENOUS

## 2011-09-05 MED ORDER — PROPOFOL 10 MG/ML IV EMUL
INTRAVENOUS | Status: AC
  Filled 2011-09-05: qty 20

## 2011-09-05 MED ORDER — DEXAMETHASONE SODIUM PHOSPHATE 10 MG/ML IJ SOLN
INTRAMUSCULAR | Status: DC | PRN
  Administered 2011-09-05: 10 mg via INTRAVENOUS

## 2011-09-05 MED ORDER — KETOROLAC TROMETHAMINE 30 MG/ML IJ SOLN
30.0000 mg | Freq: Four times a day (QID) | INTRAMUSCULAR | Status: DC
  Administered 2011-09-05: 30 mg via INTRAMUSCULAR

## 2011-09-05 SURGICAL SUPPLY — 31 items
BARRIER ADHS 3X4 INTERCEED (GAUZE/BANDAGES/DRESSINGS) ×2 IMPLANT
BENZOIN TINCTURE PRP APPL 2/3 (GAUZE/BANDAGES/DRESSINGS) ×2 IMPLANT
CANISTER SUCTION 2500CC (MISCELLANEOUS) ×2 IMPLANT
CHLORAPREP W/TINT 26ML (MISCELLANEOUS) ×2 IMPLANT
CLOSURE STERI STRIP 1/2 X4 (GAUZE/BANDAGES/DRESSINGS) ×2 IMPLANT
CLOTH BEACON ORANGE TIMEOUT ST (SAFETY) ×2 IMPLANT
CONT PATH 16OZ SNAP LID 3702 (MISCELLANEOUS) ×2 IMPLANT
DRAPE UTILITY XL STRL (DRAPES) ×2 IMPLANT
DRESSING TELFA 8X3 (GAUZE/BANDAGES/DRESSINGS) ×2 IMPLANT
DRSG PAD ABDOMINAL 8X10 ST (GAUZE/BANDAGES/DRESSINGS) ×2 IMPLANT
GLOVE BIO SURGEON STRL SZ 6.5 (GLOVE) ×2 IMPLANT
GLOVE BIOGEL PI IND STRL 7.0 (GLOVE) ×2 IMPLANT
GLOVE BIOGEL PI INDICATOR 7.0 (GLOVE) ×2
GOWN PREVENTION PLUS LG XLONG (DISPOSABLE) ×6 IMPLANT
HEMOSTAT SURGICEL 2X14 (HEMOSTASIS) ×2 IMPLANT
NS IRRIG 1000ML POUR BTL (IV SOLUTION) ×2 IMPLANT
PACK ABDOMINAL GYN (CUSTOM PROCEDURE TRAY) ×2 IMPLANT
PAD OB MATERNITY 4.3X12.25 (PERSONAL CARE ITEMS) ×2 IMPLANT
SPONGE GAUZE 4X4 12PLY (GAUZE/BANDAGES/DRESSINGS) ×2 IMPLANT
SPONGE LAP 18X18 X RAY DECT (DISPOSABLE) ×4 IMPLANT
SUT CHROMIC 2 0 CT 1 (SUTURE) ×4 IMPLANT
SUT PLAIN 2 0 XLH (SUTURE) IMPLANT
SUT VIC AB 0 CT1 18XCR BRD8 (SUTURE) ×2 IMPLANT
SUT VIC AB 0 CT1 36 (SUTURE) ×2 IMPLANT
SUT VIC AB 0 CT1 8-18 (SUTURE) ×2
SUT VIC AB 4-0 KS 27 (SUTURE) ×2 IMPLANT
SUT VICRYL 0 TIES 12 18 (SUTURE) ×2 IMPLANT
TAPE CLOTH SURG 4X10 WHT LF (GAUZE/BANDAGES/DRESSINGS) ×2 IMPLANT
TOWEL OR 17X24 6PK STRL BLUE (TOWEL DISPOSABLE) ×4 IMPLANT
TRAY FOLEY CATH 14FR (SET/KITS/TRAYS/PACK) ×2 IMPLANT
WATER STERILE IRR 1000ML POUR (IV SOLUTION) IMPLANT

## 2011-09-05 NOTE — Brief Op Note (Signed)
09/05/2011  6:12 PM  PATIENT:  Chloe Dennis  50 y.o. female  PRE-OPERATIVE DIAGNOSIS:  Pelvic Pain, Uterine Fibroids  POST-OPERATIVE DIAGNOSIS:  Pelvic Pain, Uterine Fibroids  PROCEDURE:  Procedure(s): HYSTERECTOMY ABDOMINAL. LYSIS OF ADHESIONS  SURGEON:  Surgeon(s): Geryl Rankins, MD Dorien Chihuahua. Richardson Dopp  PHYSICIAN ASSISTANT: Gerald Leitz, M.D.  ASSISTANTS: Technician    ANESTHESIA:   general  EBL:  Total I/O In: 2100 [I.V.:2100] Out: 550 [Urine:400; Blood:150]  BLOOD ADMINISTERED:none  DRAINS: Urinary Catheter (Foley)   LOCAL MEDICATIONS USED:  NONE  SPECIMEN:  Source of Specimen:  uterus with cervix  DISPOSITION OF SPECIMEN:  PATHOLOGY  COUNTS:  YES  TOURNIQUET:  * No tourniquets in log *  DICTATION: .Other Dictation: Dictation Number [id number]  PLAN OF CARE: Admit to inpatient   PATIENT DISPOSITION:  PACU - hemodynamically stable.   Delay start of Pharmacological VTE agent (>24hrs) due to surgical blood loss or risk of bleeding:  YES

## 2011-09-05 NOTE — Anesthesia Preprocedure Evaluation (Signed)
Anesthesia Evaluation  Patient identified by MRN, date of birth, ID band Patient awake    Reviewed: Allergy & Precautions, H&P , NPO status , Patient's Chart, lab work & pertinent test results, reviewed documented beta blocker date and time   History of Anesthesia Complications Negative for: history of anesthetic complications  Airway Mallampati: I      Dental  (+) Teeth Intact   Pulmonary neg pulmonary ROS,  clear to auscultation  Pulmonary exam normal       Cardiovascular Exercise Tolerance: Good neg cardio ROS regular Normal    Neuro/Psych PSYCHIATRIC DISORDERS (anxiety, depression) Negative Neurological ROS     GI/Hepatic negative GI ROS, Neg liver ROS,   Endo/Other  Negative Endocrine ROS  Renal/GU negative Renal ROS  Genitourinary negative   Musculoskeletal   Abdominal   Peds  Hematology negative hematology ROS (+)   Anesthesia Other Findings   Reproductive/Obstetrics negative OB ROS                           Anesthesia Physical Anesthesia Plan  ASA: II  Anesthesia Plan: General   Post-op Pain Management:    Induction:   Airway Management Planned:   Additional Equipment:   Intra-op Plan:   Post-operative Plan:   Informed Consent: I have reviewed the patients History and Physical, chart, labs and discussed the procedure including the risks, benefits and alternatives for the proposed anesthesia with the patient or authorized representative who has indicated his/her understanding and acceptance.   Dental Advisory Given  Plan Discussed with: CRNA and Surgeon  Anesthesia Plan Comments:         Anesthesia Quick Evaluation

## 2011-09-05 NOTE — Anesthesia Postprocedure Evaluation (Signed)
  Anesthesia Post-op Note  Patient: Chloe Dennis  Procedure(s) Performed:  HYSTERECTOMY ABDOMINAL  Patient is awake and responsive. Pain and nausea are reasonably well controlled. Vital signs are stable and clinically acceptable. Oxygen saturation is clinically acceptable. There are no apparent anesthetic complications at this time. Patient is ready for discharge.

## 2011-09-05 NOTE — Transfer of Care (Signed)
Immediate Anesthesia Transfer of Care Note  Patient: Chloe Dennis  Procedure(s) Performed:  HYSTERECTOMY ABDOMINAL  Patient Location: PACU  Anesthesia Type: General  Level of Consciousness: oriented and sedated  Airway & Oxygen Therapy: Patient Spontanous Breathing and Patient connected to nasal cannula oxygen  Post-op Assessment: Report given to PACU RN and Post -op Vital signs reviewed and stable  Post vital signs: stable  Complications: No apparent anesthesia complications

## 2011-09-05 NOTE — H&P (Signed)
08/08/2011   Reason for Appointment  1. Annual/Pre-op   History of Present Illness  General:  50 y/o G1P1 with h/o uterine fibroids, abdominal pain and menorrhagia presents for her annual exam. She desires TAH so this will serve as her pre-op visit as well. Pt reports significant lower abdominal pain that is persisitant. She has GI issues that will be evaluated by endoscopy and colonoscopy prior to surgery.   Current Medications  Lamictal 25 Tablet 1 tablet qhs  Medication List reviewed and reconciled with the patient   Past Medical History  bipolar disorder Dr. Burna Sis  Hx insomnia  Dry eyes  Parethesias [facility] Neuro 4/12  Multiple thyroid nodules, 2 dominant lesions in right lobe, rec bx 5/12,cyst and nonneoplastic goiter  Mild anemia Dr. Myna Hidalgo 5/12, s/p IV iron  Hypertension   Surgical History  C-Section x 1    Family History  Father: alive HTN, high cholesterol   Mother: deceased PE, lung cancer, high cholesterol, HTN   Maternal Grand Mother: Colon cancer   Brother 1: alive hernia, fatty liver, high cholesterol   Sister 1: alive HTN, DM, obesity, schizophrenia   Sister 2: alive macular degeneration   5 sister(s) .   negative thyroid disease.   Social History  General:  History of smoking cigarettes: Never smoked.  no Smoking, never.  Alcohol: Rare.  Caffeine: yes, 1-2 cups of coffee.  no Recreational drug use.  no Diet.  Exercise: yes, minimal.  Occupation: unemployed.  Marital Status: married.  Children: 1.  11.   Gyn History  Sexual activity currently sexually active.  Periods : every month.  LMP 06/30/11.  Denies H/O Birth control .  Last pap smear date 10/20/09, WNL.  Last mammogram date 11/03/09.  Denies H/O Abnormal pap smear .    OB History  Number of pregnancies 1.  Pregnancy # 1 live birth, C-section delivery.    Allergies  Penicillin (for allergy): hives   Hospitalization/Major Diagnostic Procedure  childbirth x 1   behavioral  health x 3 weeks 12/2010   Review of Systems  CONSTITUTIONAL:  Fatigue none. Fever none today.  SKIN:  Rash no. Suspicious lesions no.  CARDIOLOGY:  Chest pain none.  RESPIRATORY:  Shortness of breath no. Cough no.  GASTROENTEROLOGY:  Appetite change yes. Change in bowel habits diarrhea, constipation.  FEMALE REPRODUCTIVE:  Breast lumps or discharge no. Breast pain none. Dyspareunia none. Dysuria no. Pelvic pain none. Regular menses yes. Unusual vaginal discharge no. Vaginal itching no. Vulvar/labial lesion no.  NEUROLOGY:  Migraines none. Tingling/numbness none. Visual changes none.  PSYCHOLOGY:  Depression no.  ENDOCRINOLOGY:  Hot flashes none. Weight gain no unintentional. Weight loss none.  HEMATOLOGY/LYMPH:  Anemia no.     Vital Signs  Wt 104, Ht 61, BMI 19.65, Pulse sitting 76, BP sitting 110/68.   Physical Examination  GENERAL:  Patient appears in NAD, pleasant.  Build: well developed.  Race: caucasian.  NECK:  Cervical lymph nodes: unremarkable, no lymphadenopathy.  ROM: normal.  Thyroid: no thyromegaly, non tender.  LUNGS:  Breath sounds: clear to auscultation.  Dyspnea: no.  HEART:  Murmurs: none.  Rate: normal.  Rhythm: regular.  BREASTS:  Axilla: no palpable masses, no lymphadenopathy, nontender.  Breast Mass: no masses, dimpling, retraction, or nipple discharge , bilaterally.  Skin Change: unremarkable.  ABDOMEN:  General: no masses,tenderness, no CVAT, enlarged uterus 3 FB below umbilicus.  FEMALE GENITOURINARY:  Adnexa: no mass, non tender.  Anus/perineum: normal, no lesions.  Cervix/ cuff: normal  appearance , no lesions/discharge/bleeding, Pap smear performed, good pelvic support , external os normal .  External genitalia: normal, no lesions, no skin discoloration, no lymphadenopathy.  Urethra: normal external meatus.  Uterus: freely mobile, irregularly enlarged, tender with palpation, firm, 16 weeks.  Rectum: deferred.  Vagina: pink/moist  mucosa, no lesions, no abnormal discharge, odorless, white, discharge, odorless, scant amount.  Vulva: normal, no lesions, no skin discoloration, non tender.  EXTREMITIES:  Extremities FROM of all extremities.  NEUROLOGICAL:  Gait: normal.  Orientation: alert and oriented x 3.     Assessments   1. Routine gynecological examination - V72.31 (Primary)   2. Pelvic pain (female) - 625.9   3. Fibroids - 218.9   Treatment  1. Routine gynecological examination  LAB: Pap Lb (Liquid-based) ([place])    Telesforo Brosnahan B 08/10/2011 05:37:02 PM > Neg pap, letter sent.   2. Pelvic pain (female)  Diagnostic Imaging:US ECHO TRANSVAGINAL  Pt scheduled for TAH after GI evaluation. Pt and husband counseled on R/B/A of procedure and all questions answered.    Preventive Medicine  Screening / Special Tests:  Mammogram due now and patient will schedule.  Colonoscopy Will be done soon.  Bone mineral Density when perimenopausal x 5 years.   Health Promotion 19-39:  Calcium 1200 mg, daily.  Vitamin D 800 IU.    Procedure Codes  16109 ECHO TRANSVAGINAL  88142 ECL PAP, INFO ONLY   Follow Up  prn (Reason: Post op)

## 2011-09-05 NOTE — Interval H&P Note (Signed)
History and Physical Interval Note:   09/05/2011   2:51 PM   Chloe Dennis  has presented today for surgery, with the diagnosis of Pelvic Pain  The various methods of treatment have been discussed with the patient and family. After consideration of risks, benefits and other options for treatment, the patient has consented to  Procedure(s): HYSTERECTOMY ABDOMINAL as a surgical intervention .  The patients' history has been reviewed, patient examined, no change in status, stable for surgery.  I have reviewed the patients' chart and labs.  Questions were answered to the patient's satisfaction.     No rebound or guarding.  Pt complaining of pain, 7/10.  Surgery delayed due to a surgical emergency of my assistant.  Discussed with Dr. Rodman Pickle.  Fentanyl to be given.   Geryl Rankins  MD

## 2011-09-05 NOTE — Op Note (Signed)
NAMEMarland Kitchen  FAATIMA, TENCH NO.:  [id number]  MEDICAL RECORD NO.:  [id number]  LOCATION:  9310                          FACILITY:  [facility]  PHYSICIAN:  Pieter Partridge, MD   DATE OF BIRTH:  09/25/1961  DATE OF PROCEDURE:  09/05/2011 DATE OF DISCHARGE:                              OPERATIVE REPORT   PREOPERATIVE DIAGNOSES:  Pelvic pain and uterine fibroids.  POSTOPERATIVE DIAGNOSES:  Pelvic pain, uterine fibroids, and adhesions.  PROCEDURES:  Abdominal hysterectomy and lysis of adhesions.  SURGEON:  Shela Nevin. Dion Body, MD  ASSISTANT:  Gerald Leitz, MD, and technician.  ANESTHESIA:  General.  She had 2100 IV fluids in and 400 of clear urine out and blood 150. NovoLog was administered.  She had a Foley catheter placed at the beginning of the case.  No local anesthesia was used.  FINDINGS:  The patient had a small uterus but probably a 7 cm fundal fibroid and 3 or 4 cm right pedunculated fibroid that was calcified. She had adhesions in the posterior cul-de-sac and of note, her tissues were very elastic and very thin almost consistent with a smoker or chronic malnutrition.  SOURCE OF SPECIMEN:  Uterus with cervix.  DISPOSITION OF SPECIMEN:  To pathology.  COUNTS:  Correct.  The patient was transferred to PACU in stable condition and will be admitted as an inpatient.  PROCEDURE IN DETAIL:  The patient was identified in the holding area. She stated at that time that she has some abdominal pain.  Her case was delayed approximately 45 minutes so she was given a fentanyl prior to the procedure which seemed to resolve her discomfort.  When ready, she was taken to the operating room with IV running.  She underwent general endotracheal anesthesia without complication.  She was then prepped and draped in the dorsal supine position in the normal usual fashion.  Foley catheter was placed as well.  The patient had a previous Pfannenstiel skin incision that was well healed.   That was marked and 2nd Pfannenstiel skin incision was made with a scalpel and carried down to the underlying layer of the fascia. The fascia was then incised with the Bovie cautery and the fascial incision was extended laterally with the curved Mayo scissors.  Kocher clamps were used to grasp the upper lip of the fascia and the rectus muscles which dissected sharply off of the fascia.  Same was done on the lower leaf.  The patient was noted to have very elastic tissues.  The abdomen was entered easily with Metzenbaum scissors transecting the peritoneum.  The abdomen was stretched.  The uterus was noted to be globular one with several fibroids.  The Lenox Ahr retractor was then placed and the bowel was then packed with 2 moist laparotomy sponges.  While I was packing, it was noted that there were some adhesions in the left lower quadrant, and so I gently packed on that side.  The uterus was then easily delivered through the incision up from the pelvis.  Two Kelly clamps were used to clamp the adnexa including the round ligament, fallopian tubes and utero- ovarian ligaments on both sides.  The round ligament was  then grasped with a Kocher clamp and transected and suture ligated with 0 Vicryl (if otherwise stated there, we will only use 0 Vicryl pop-off for most of the case).  The utero-ovarian ligament was then grasped with a curved Haney and a window was made in the broad ligament and the uteroovarian ligament was transected.  The pedicle was tied with a free hand tie and then with a 0 Vicryl pop-off and a Heaney stitch.  The same was done on the opposite side.  Prior to transecting, the ovary and fallopian tube on the right-hand side, the bladder flap was developed with the Metzenbaum scissors.  There were significant adhesions of the bladder to the anterior cul-de-sac that was taken down sharply as well.  Once the utero-ovarian ligaments were transected on both sides, some of  the adhesions on the bladder and on the lower right-hand side were taken down sharply with the Metzenbaum scissors.  The uterine arteries were skeletonized and clamped with a curved Haney clamp and suture ligated. Same was done on the opposite side.  Prior to transecting the uterine artery, the bladder was separated from the lower uterine segment. Again, there were some adhesions that were taken down sharply with Metzenbaum, Bovie and a sponge stick.  Once the uteroovarian ligaments were transected on both sides, the cervix was skeletonized with the straight Heaney clamps and the uterosacral ligaments were grasped and tagged.  The angles of the vagina and the cervix were grasped with a curved Heaney and the vagina was entered sharply with the curved Mayo scissors and the uterus was transected easily.  The vagina was then grasped with the Allis clamps.  The 1-0 Vicryl pops and a figure-of- eights were used to close the vaginal cuff.  The most lateral stitches were tied to the angles.  The vagina was trimmed prior to closure.  Of note, the patient did have some adhesions in the posterior cul-de-sac that were taken down to complete the hysterectomy.  They were manually broken up to get the rectum off of the posterior uterus and that was necessary to grasp the uterosacral ligaments.  The vaginal cuff was hemostatic and the bladder was hemostatic as well. There was some bleeding in the posterior cul-de-sac.  Hemostasis was achieved with the Bovie and a 2-0 Vicryl figure-of-eight very superficial on the right-hand side.  We copiously irrigated to identify any bleeders.  In that posterior cul-de-sac since there was so much adhesive disease, I did place Interceed.  There was still some slight oozing, so Surgicel was also applied to that area and to the vaginal cuff and to the left pelvic sidewall.  The ovarian pedicles were plicated to the round ligaments with 0-Vicryl suture.  The 2  laparotomy sponges were removed.  The adhesions on the left-hand side were undisturbed.  They were thick and did not think it was in the best interest of the patient to have this done at this time.  They did not appear as obstructive as the ones in the posterior cul-de-sac.  The rectus muscles and the fascia was identified for any bleeding. Hemostasis was achieved with the Bovie cautery.  The fascia was then reapproximated with 0 Vicryl with 2 continuous running sutures of 0 Vicryl.  The subcutaneous space was very thin, so copiously irrigated and cauterized any bleeders.  I did not use a 2-0 plain gut.  The skin was then reapproximated with 4-0 Vicryl on a Keith needle and Steri- Strips were applied to the incision  and a pressure dressing was used.  All instrument, sponge, and needle counts were correct x3. The patient was taken to PACU in stable condition.  She received gentamicin and Flagyl prior to the procedure. SCDs were in place as well.     Pieter Partridge, MD     EBV/MEDQ  D:  09/05/2011  T:  09/05/2011  Job:  [id number]

## 2011-09-05 NOTE — H&P (View-Only) (Signed)
08/08/2011   Reason for Appointment  1. Annual/Pre-op   History of Present Illness  General:  50 y/o G1P1 with h/o uterine fibroids, abdominal pain and menorrhagia presents for her annual exam. She desires TAH so this will serve as her pre-op visit as well. Pt reports significant lower abdominal pain that is persisitant. She has GI issues that will be evaluated by endoscopy and colonoscopy prior to surgery.   Current Medications  Lamictal 25 Tablet 1 tablet qhs  Medication List reviewed and reconciled with the patient   Past Medical History  bipolar disorder Dr. Mark Schneider  Hx insomnia  Dry eyes  Parethesias [facility] Neuro 4/12  Multiple thyroid nodules, 2 dominant lesions in right lobe, rec bx 5/12,cyst and nonneoplastic goiter  Mild anemia Dr. Ennever 5/12, s/p IV iron  Hypertension   Surgical History  C-Section x 1    Family History  Father: alive HTN, high cholesterol   Mother: deceased PE, lung cancer, high cholesterol, HTN   Maternal Grand Mother: Colon cancer   Brother 1: alive hernia, fatty liver, high cholesterol   Sister 1: alive HTN, DM, obesity, schizophrenia   Sister 2: alive macular degeneration   5 sister(s) .   negative thyroid disease.   Social History  General:  History of smoking cigarettes: Never smoked.  no Smoking, never.  Alcohol: Rare.  Caffeine: yes, 1-2 cups of coffee.  no Recreational drug use.  no Diet.  Exercise: yes, minimal.  Occupation: unemployed.  Marital Status: married.  Children: 1.  11.   Gyn History  Sexual activity currently sexually active.  Periods : every month.  LMP 06/30/11.  Denies H/O Birth control .  Last pap smear date 10/20/09, WNL.  Last mammogram date 11/03/09.  Denies H/O Abnormal pap smear .    OB History  Number of pregnancies 1.  Pregnancy # 1 live birth, C-section delivery.    Allergies  Penicillin (for allergy): hives   Hospitalization/Major Diagnostic Procedure  childbirth x 1   behavioral  health x 3 weeks 12/2010   Review of Systems  CONSTITUTIONAL:  Fatigue none. Fever none today.  SKIN:  Rash no. Suspicious lesions no.  CARDIOLOGY:  Chest pain none.  RESPIRATORY:  Shortness of breath no. Cough no.  GASTROENTEROLOGY:  Appetite change yes. Change in bowel habits diarrhea, constipation.  FEMALE REPRODUCTIVE:  Breast lumps or discharge no. Breast pain none. Dyspareunia none. Dysuria no. Pelvic pain none. Regular menses yes. Unusual vaginal discharge no. Vaginal itching no. Vulvar/labial lesion no.  NEUROLOGY:  Migraines none. Tingling/numbness none. Visual changes none.  PSYCHOLOGY:  Depression no.  ENDOCRINOLOGY:  Hot flashes none. Weight gain no unintentional. Weight loss none.  HEMATOLOGY/LYMPH:  Anemia no.     Vital Signs  Wt 104, Ht 61, BMI 19.65, Pulse sitting 76, BP sitting 110/68.   Physical Examination  GENERAL:  Patient appears in NAD, pleasant.  Build: well developed.  Race: caucasian.  NECK:  Cervical lymph nodes: unremarkable, no lymphadenopathy.  ROM: normal.  Thyroid: no thyromegaly, non tender.  LUNGS:  Breath sounds: clear to auscultation.  Dyspnea: no.  HEART:  Murmurs: none.  Rate: normal.  Rhythm: regular.  BREASTS:  Axilla: no palpable masses, no lymphadenopathy, nontender.  Breast Mass: no masses, dimpling, retraction, or nipple discharge , bilaterally.  Skin Change: unremarkable.  ABDOMEN:  General: no masses,tenderness, no CVAT, enlarged uterus 3 FB below umbilicus.  FEMALE GENITOURINARY:  Adnexa: no mass, non tender.  Anus/perineum: normal, no lesions.  Cervix/ cuff: normal   appearance , no lesions/discharge/bleeding, Pap smear performed, good pelvic support , external os normal .  External genitalia: normal, no lesions, no skin discoloration, no lymphadenopathy.  Urethra: normal external meatus.  Uterus: freely mobile, irregularly enlarged, tender with palpation, firm, 16 weeks.  Rectum: deferred.  Vagina: pink/moist  mucosa, no lesions, no abnormal discharge, odorless, white, discharge, odorless, scant amount.  Vulva: normal, no lesions, no skin discoloration, non tender.  EXTREMITIES:  Extremities FROM of all extremities.  NEUROLOGICAL:  Gait: normal.  Orientation: alert and oriented x 3.     Assessments   1. Routine gynecological examination - V72.31 (Primary)   2. Pelvic pain (female) - 625.9   3. Fibroids - 218.9   Treatment  1. Routine gynecological examination  LAB: Pap Lb (Liquid-based) ([place])    [facility] Tolson B 08/10/2011 05:37:02 PM > Neg pap, letter sent.   2. Pelvic pain (female)  Diagnostic Imaging:US ECHO TRANSVAGINAL  Pt scheduled for TAH after GI evaluation. Pt and husband counseled on R/B/A of procedure and all questions answered.    Preventive Medicine  Screening / Special Tests:  Mammogram due now and patient will schedule.  Colonoscopy Will be done soon.  Bone mineral Density when perimenopausal x 5 years.   Health Promotion 19-39:  Calcium 1200 mg, daily.  Vitamin D 800 IU.    Procedure Codes  76830 ECHO TRANSVAGINAL  88142 ECL PAP, INFO ONLY   Follow Up  prn (Reason: Post op)    

## 2011-09-06 LAB — CBC
Hemoglobin: 11 g/dL — ABNORMAL LOW (ref 12.0–15.0)
MCHC: 34 g/dL (ref 30.0–36.0)
Platelets: 256 10*3/uL (ref 150–400)
RBC: 3.47 MIL/uL — ABNORMAL LOW (ref 3.87–5.11)

## 2011-09-06 NOTE — Progress Notes (Signed)
Dr. Dion Body updated on patients condition ( as noted in previous note) , suicidal precaution protocol in place. Sitter, P.Cates NT with HC,Tanya S. Searched the room for any contraband that might hurt patient. Patient requested to see a chaplain at this time, paged for chaplain with return call.

## 2011-09-06 NOTE — Progress Notes (Signed)
UR Chart review completed.  

## 2011-09-06 NOTE — Anesthesia Postprocedure Evaluation (Signed)
  Anesthesia Post-op Note  Patient: Chloe Dennis  Procedure(s) Performed:  HYSTERECTOMY ABDOMINAL  Patient Location: [facility]  Anesthesia Type: General  Level of Consciousness: awake, alert  and oriented  Airway and Oxygen Therapy: Patient Spontanous Breathing  Post-op Pain: none  Post-op Assessment: Post-op Vital signs reviewed and Patient's Cardiovascular Status Stable  Post-op Vital Signs: Reviewed and stable  Complications: No apparent anesthesia complications

## 2011-09-06 NOTE — Progress Notes (Signed)
Subjective: Patient reports incisional pain.  However she states that the neurologic abdominal pain she was having prior to surgery is worse than her incisional pain. She states that the pca does not work. She had an episode of nausea with slight emesis last night. She denies flatus. Foley is still in place. She denies SI. She states " I dont want to die" Objective: I have reviewed patient's vital signs, intake and output and medications.  General: tearful during conversation. . no acute distress  GI: incision: clean, dry and intact Extremities: extremities normal, atraumatic, no cyanosis or edema Abdomen is soft appropriately tender nondistended + BS all 4 quadrants   Assessment/Plan: POD#1 TAH Suicidal ideations last night. Psychiatry consult to be called in by Dr. Dion Body D/C foley Clear liquid diet Ambulate   LOS: 1 day    Chloe Dennis J. 09/06/2011, 8:52 AM

## 2011-09-06 NOTE — Progress Notes (Signed)
Spiritual Care - On call chaplain from last evening visited w/patient after receiving request from RN.  Today I visited patient and spent about 30 minutes with her and her husband.  Patient states she is bi-polar and at this point feels only negative emotions which she reported in detail and states she has been this way for more than a year and has had many medical tests done.  She reports feeling a great deal of fear and many other negative emotions.  She stated that her "God light" has gone out.  She states that she prays certain Catholic prayers on a frequent basis.  We shared a prayer together.  Will follow.  Dory Horn

## 2011-09-06 NOTE — Addendum Note (Signed)
Addendum  created 09/06/11 1610 by Suella Grove   Modules edited:Notes Section

## 2011-09-06 NOTE — Progress Notes (Signed)
Patient has complained that she is not resting and feels restless; " I think my pain is not being relieved.  Post-op assessment WNL ( refer to charting).  Was assisted to OOB earlier  And seemed to rest after.  Benadryl 12.5 mgs given but patient claims no relief.  Has verbalized her psychological problems like depression and has said while talking to this nurse, " Im scared  I might hurt myself."  HC, Tanya S. Notified and monitored patient closely.

## 2011-09-07 MED ORDER — BISACODYL 10 MG RE SUPP
10.0000 mg | Freq: Every day | RECTAL | Status: DC | PRN
Start: 2011-09-07 — End: 2011-09-08

## 2011-09-07 MED ORDER — ENSURE CLINICAL ST REVIGOR PO LIQD
237.0000 mL | Freq: Three times a day (TID) | ORAL | Status: DC
  Administered 2011-09-07 – 2011-09-08 (×2): 237 mL via ORAL
  Filled 2011-09-07 (×4): qty 237

## 2011-09-07 MED ORDER — BISACODYL 5 MG PO TBEC
5.0000 mg | DELAYED_RELEASE_TABLET | Freq: Every day | ORAL | Status: DC | PRN
  Administered 2011-09-07: 5 mg via ORAL
  Filled 2011-09-07: qty 1

## 2011-09-07 MED ORDER — ANIMAL SHAPES WITH C & FA PO CHEW
1.0000 | CHEWABLE_TABLET | Freq: Every day | ORAL | Status: DC
  Administered 2011-09-07: 20:00:00 via ORAL
  Administered 2011-09-08: 1 via ORAL
  Filled 2011-09-07 (×3): qty 1

## 2011-09-07 NOTE — Progress Notes (Signed)
Spiritual Care - Spoke briefly with Chloe Dennis [facility] as she was walking in the hallway with her husband and a Best boy.  She was crying saying she was in a lot of pain and having to wait on Dr to come to get pain medicine.  Dory Horn

## 2011-09-07 NOTE — Progress Notes (Signed)
UR Chart review completed.  

## 2011-09-07 NOTE — Consult Note (Signed)
Patient Identification:  Chloe Dennis Date of Evaluation:  09/07/2011   History of Present Illness: 50 year old Caucasian female with history of bipolar disorder  is [facility]. Patient is currently not on any medications for bipolar disorder. When I asked her about the medications she gave the explanation that her central nervous system which includes peripheral  nervous system and autonomic nervous system are affected so the medications will not work. She want neurology consult. She denied that she feels suicidal and she is never going to kill herself. I tried to discuss with her about medications but she don't want to take any medications at this time for example Seroquel Abilify Depakote Lamictal.  She is logical and goal directed during the interview is not hallucinating or delusional. Alert awake oriented x3 not suicidal or homicidal. But she is very frustrated because of the pain she has and wants pain medications.   Past Medical History:     Past Medical History  Diagnosis Date  . Anemia     03/2011  . Blood transfusion     2012 in [facility]  . Anxiety     no meds  . Depression     no meds, bipolar no meds       Past Surgical History  Procedure Date  . Upper gastrointestinal endoscopy 08/2011  . Cesarean section 1988    x 1  . Colonscopy 08/2011    Allergies:  Allergies  Allergen Reactions  . Penicillins Hives    whelps    Current Medications:  Prior to Admission medications   Medication Sig Start Date End Date Taking? Authorizing Provider  ibuprofen (ADVIL,MOTRIN) 800 MG tablet Take 800 mg by mouth every 8 (eight) hours as needed.      Historical Provider, MD  LamoTRIgine (LAMICTAL PO) Take by mouth.     Historical Provider, MD    Social History:    reports that she has never smoked. She has never used smokeless tobacco. She reports that she does not drink alcohol or use illicit drugs.   Family History:    History reviewed.  No pertinent family history.    Assessment:   AXIS I  Bipolar disorder depressed type  AXIS II  Deffered  AXIS III See medical notes.  AXIS IV  medical issues.   AXIS V 51-60 moderate symptoms     Recommendations: #1 sitter can be discontinued at this time As patient is not actively suicidal. #2 I will not start her on any medications at this time and I recommended her to followup with her psychiatrist in the outpatient setting.    Eulogio Ditch, MD 11/6/201211:40 AM

## 2011-09-07 NOTE — Progress Notes (Addendum)
2 Days Post-Op Procedure(s): HYSTERECTOMY ABDOMINAL  Subjective: Patient reports tolerating PO.   C/o constipation.  Desires medication to help with it. Pt reports the nerve pain that she had prior to surgery is the same. She feels that medications do not work b/c they are not getting absorbed.  She feels that something is compressing her parasympathetic nerve system and won't let her absorb the good stuff.  She feels it going up her back into her brain.  She reports her back is always stiff and can be appreciated by touching it.  Objective: I have reviewed patient's vital signs, intake and output and labs.  General: alert and cooperative Anxious. Abdomen:  Incision c/d/i.  +BS. Slightly distended. Ext: no edema, no calf tenderness. Back:  WNL Assessment: s/p Procedure(s): HYSTERECTOMY ABDOMINAL: stable Parathesias- Plan to f/u with Neurology at [facility] after recovered from surgery.  Pt's symptoms do not seem medically logical Depression and anxiety-chronic.  Pt does not want medications b/c she does not feel they will work.  Declines Ambien, Lunesta, Bendryl.  Pt not acutely suicidal per psychiatry.  Appreciate consultation.  Will set up f/u with her doctor after discharge.  Plan: Add Ensure to meals as pt will not eat solid foods.  Explained she needs proper nutrition for wound healing. Multivitamin Continue po pain meds.  Encouraged Ibuprofen 800mg  to help with postop inflammation. Discharge home tomorrow. Dulcolax for BM.  LOS: 2 days    Chloe Dennis 09/07/2011, 5:32 PM

## 2011-09-08 MED ORDER — ANIMAL SHAPES WITH C & FA PO CHEW: 2.0000 | CHEWABLE_TABLET | Freq: Every day | ORAL | Status: DC

## 2011-09-08 MED ORDER — ENSURE CLINICAL ST REVIGOR PO LIQD: 237.0000 mL | Freq: Three times a day (TID) | ORAL | Status: DC

## 2011-09-08 MED ORDER — ONDANSETRON HCL 4 MG PO TABS: 4.0000 mg | ORAL_TABLET | Freq: Four times a day (QID) | ORAL | Status: AC | PRN

## 2011-09-08 MED ORDER — IBUPROFEN 800 MG PO TABS: 800.0000 mg | ORAL_TABLET | Freq: Three times a day (TID) | ORAL | Status: AC | PRN

## 2011-09-08 MED ORDER — OXYCODONE-ACETAMINOPHEN 5-325 MG PO TABS: 1.0000 | ORAL_TABLET | ORAL | Status: AC | PRN

## 2011-09-08 NOTE — Progress Notes (Signed)
3 Days Post-Op Procedure(s): HYSTERECTOMY ABDOMINAL  Subjective: Patient reports nausea this am.  Starting to eat solid foods.  Pt states she will attempt to eat adequately despite what her brain tells her.  She was not aware what she was doing to herself.  Pt reports she has been eating very little and putting her foods in the blender b/c her brain told her she would hurt if she eats.  She is committed to getting better.  She will f/u with Psych. Objective: I have reviewed patient's vital signs, intake and output and labs.  General: alert and cooperative GI: Soft.  Incision stable.  Nontender. Active BS. Extremities: no edema, redness or tenderness in the calves or thighs  Assessment: s/p Procedure(s): HYSTERECTOMY ABDOMINAL: stable Tolerating regular diet.  Plan: Discharge home Give RX for MVI, Ensure and Zofran in addition to pain medications.  LOS: 3 days    Chloe Dennis 09/08/2011, 9:49 AM

## 2011-09-08 NOTE — Discharge Instructions (Signed)
Hysterectomy, Abdominal  °Care After °Please read the instructions below. Refer to these instructions for the next few weeks. These instructions provide you with general information on caring for yourself after surgery. Your caregiver may also give you specific instructions. While your treatment has been planned according to the most current medical practices available, unavoidable problems sometimes happen. If you have any problems or questions after you leave, please call your caregiver. °HOME CARE INSTRUCTIONS  °Healing will take time. You will have discomfort, tenderness, swelling and bruising at the operative site for a couple of weeks. This is normal and will get better as time goes on.  °· Only take over-the-counter or prescription medicines for pain, discomfort or fever as directed by your caregiver.  °· Do not take aspirin. It can cause bleeding.  °· Do not drive when taking pain medication.  °· Follow your caregiver's advice regarding diet, exercise, lifting, driving and general activities.  °· Resume your usual diet as directed and allowed.  °· Get plenty of rest and sleep.  °· Do not douche, use tampons, or have sexual intercourse until your caregiver gives you permission.  °· Change your bandages (dressings) as directed.  °· Take your temperature twice a day. Write it down.  °· Your caregiver may recommend showers instead of baths for a few weeks.  °· Do not drink alcohol until your caregiver gives you permission.  °· If you develop constipation, you may take a mild laxative with your caregiver's permission. Bran foods and drinking fluids helps with constipation problems.  °· Try to have someone home with you for a week or two to help with the household activities.  °· Make sure you and your family understands everything about your operation and recovery.  °· Do not sign any legal documents until you feel normal again.  °· Keep all your follow-up appointments as recommended by your caregiver.  °SEEK  MEDICAL CARE IF:  °· There is swelling, redness or increasing pain in the wound area.  °· Pus is coming from the wound.  °· You notice a bad smell from the wound or surgical dressing.  °· You have pain, redness and swelling from the intravenous site.  °· The wound is breaking open (the edges are not staying together).  °· You feel dizzy or feel like fainting.  °· You develop pain or bleeding when you urinate.  °· You develop diarrhea.  °· You develop nausea and vomiting.  °· You develop abnormal vaginal discharge.  °· You develop a rash.  °· You have any type of abnormal reaction or develop an allergy to your medication.  °· You need stronger pain medication for your pain.  °SEEK IMMEDIATE MEDICAL CARE IF: °· You have a fever.  °· You develop abdominal pain.  °· You develop chest pain.  °· You develop shortness of breath.  °· You pass out.  °· You develop pain, swelling or redness of your leg.  °· You develop heavy vaginal bleeding with or without blood clots.  °Document Released: 04/27/2005 Document Revised: 04/13/2011 Document Reviewed: 07/10/2009 °ExitCare® Patient Information ©2012 [employer]. °

## 2011-09-08 NOTE — Discharge Summary (Signed)
Physician Discharge Summary  Patient ID: Chloe Dennis MRN: [id number] DOB/AGE: 1961-01-06 50 y.o.  Admit date: 09/05/2011 Discharge date: 09/08/2011  Admission Diagnoses: Pelvic pain, uterine fibroids,h/o bipolar disorder  Discharge Diagnoses: Same, Not acutely suicidal, malnutrition. Active Problems:  * No active hospital problems. *    Discharged Condition: good  Hospital Course: Routine post op recovery.  Pt with suicidal ideation POD#1, although that is her baseline per her psychiatrist.  Pt had difficulty distinguishing incisional pain from her chronic "nerve pain."  Pt would not eat solid foods b/c she has not eaten regular food for quite some time.  Pt states her brain tells her not to eat b/c she will have pain.  Consults: psychiatry  Significant Diagnostic Studies: labs: HG 11. Post op   Treatments: IV hydration and meal supplements, MVI.  Discharge Exam: Blood pressure 112/63, pulse 82, temperature 98.8 F (37.1 C), temperature source Oral, resp. rate 18, height 5' (1.524 m), weight 46.72 kg (103 lb), SpO2 95.00%. WNL at discharge.  Disposition: Home or Self Care  Discharge Orders    Future Orders Please Complete By Expires   Diet - low sodium heart healthy      Increase activity slowly      Discharge instructions      Comments:   SEE DISCHARGE INSTRUCTIONS.   Driving Restrictions      Comments:   NO DRIVING FOR 2 WEEKS OR AS LONG AS NARCOTICS ARE REQUIRED.   Lifting restrictions      Comments:   NO HEAVY LIFTING GREATER THAN 15 POUNDS.   Sexual Activity Restrictions      Comments:   PELVIC REST X 6 WEEKS.   Discharge wound care:      Comments:   KEEP INCISION DRY.   Call MD for:  temperature >100.4      Call MD for:  persistant nausea and vomiting      Call MD for:  severe uncontrolled pain      Call MD for:  redness, tenderness, or signs of infection (pain, swelling, redness, odor or green/yellow discharge around incision site)      Call MD for:   persistant dizziness or light-headedness        Current Discharge Medication List    START taking these medications   Details  feeding supplement (ENSURE CLINICAL STRENGTH) LIQD Take 237 mLs by mouth 3 (three) times daily with meals. Qty: 12 Bottle, Refills: 4    ondansetron (ZOFRAN) 4 MG tablet Take 1 tablet (4 mg total) by mouth every 6 (six) hours as needed for nausea. Qty: 20 tablet, Refills: 1    oxyCODONE-acetaminophen (PERCOCET) 5-325 MG per tablet Take 1-2 tablets by mouth every 3 (three) hours as needed (moderate to severe pain (when tolerating fluids)). Qty: 15 tablet, Refills: 0    Pediatric Multiple Vit-C-FA (MULTIVITAMIN ANIMAL SHAPES, WITH CA/FA,) WITH C & FA CHEW Chew 2 tablets by mouth daily. Qty: 60 tablet, Refills: 11      CONTINUE these medications which have CHANGED   Details  ibuprofen (ADVIL,MOTRIN) 800 MG tablet Take 1 tablet (800 mg total) by mouth every 8 (eight) hours as needed for pain (mild pain). Qty: 30 tablet, Refills: 1      CONTINUE these medications which have NOT CHANGED   Details  LamoTRIgine (LAMICTAL PO) Take by mouth.        Follow-up Information    Follow up with Geryl Rankins, MD in 2 weeks. (Wound check)    Contact information:  [address] [place], [place]. [address] [place] [address] [phone]          Signed: Geryl Rankins 09/08/2011, 10:01 AM

## 2011-09-09 ENCOUNTER — Encounter (HOSPITAL_COMMUNITY): Payer: Self-pay | Admitting: Obstetrics and Gynecology

## 2012-07-25 ENCOUNTER — Other Ambulatory Visit: Payer: Self-pay | Admitting: Obstetrics and Gynecology

## 2012-07-25 DIAGNOSIS — Z1231 Encounter for screening mammogram for malignant neoplasm of breast: Secondary | ICD-10-CM

## 2012-08-05 ENCOUNTER — Ambulatory Visit
Admission: RE | Admit: 2012-08-05 | Discharge: 2012-08-05 | Disposition: A | Discharge status: Home / Self Care | Payer: PRIVATE HEALTH INSURANCE | Source: Ambulatory Visit | Attending: Obstetrics and Gynecology | Admitting: Obstetrics and Gynecology

## 2012-08-05 DIAGNOSIS — Z1231 Encounter for screening mammogram for malignant neoplasm of breast: Secondary | ICD-10-CM

## 2012-09-17 ENCOUNTER — Other Ambulatory Visit: Payer: Self-pay | Admitting: Obstetrics and Gynecology

## 2013-08-03 ENCOUNTER — Other Ambulatory Visit: Payer: Self-pay

## 2013-08-03 DIAGNOSIS — Z1231 Encounter for screening mammogram for malignant neoplasm of breast: Secondary | ICD-10-CM

## 2013-08-20 ENCOUNTER — Other Ambulatory Visit: Payer: Self-pay | Admitting: Endocrinology

## 2013-08-20 DIAGNOSIS — E049 Nontoxic goiter, unspecified: Secondary | ICD-10-CM

## 2013-08-25 ENCOUNTER — Ambulatory Visit
Admission: RE | Admit: 2013-08-25 | Discharge: 2013-08-25 | Disposition: A | Discharge status: Home / Self Care | Payer: PRIVATE HEALTH INSURANCE | Source: Ambulatory Visit

## 2013-08-25 DIAGNOSIS — Z1231 Encounter for screening mammogram for malignant neoplasm of breast: Secondary | ICD-10-CM

## 2013-08-26 ENCOUNTER — Other Ambulatory Visit: Payer: PRIVATE HEALTH INSURANCE

## 2013-12-16 ENCOUNTER — Ambulatory Visit
Admission: RE | Admit: 2013-12-16 | Discharge: 2013-12-16 | Disposition: A | Discharge status: Home / Self Care | Payer: Managed Care, Other (non HMO) | Source: Ambulatory Visit | Attending: Endocrinology | Admitting: Endocrinology

## 2013-12-16 DIAGNOSIS — E049 Nontoxic goiter, unspecified: Secondary | ICD-10-CM

## 2014-02-25 ENCOUNTER — Other Ambulatory Visit: Payer: Self-pay | Admitting: Endocrinology

## 2014-02-25 DIAGNOSIS — E049 Nontoxic goiter, unspecified: Secondary | ICD-10-CM

## 2014-08-25 ENCOUNTER — Other Ambulatory Visit: Payer: Self-pay

## 2014-08-25 DIAGNOSIS — Z1231 Encounter for screening mammogram for malignant neoplasm of breast: Secondary | ICD-10-CM

## 2014-08-30 ENCOUNTER — Ambulatory Visit
Admission: RE | Admit: 2014-08-30 | Discharge: 2014-08-30 | Disposition: A | Discharge status: Home / Self Care | Payer: PRIVATE HEALTH INSURANCE | Source: Ambulatory Visit | Attending: Endocrinology | Admitting: Endocrinology

## 2014-08-30 DIAGNOSIS — E049 Nontoxic goiter, unspecified: Secondary | ICD-10-CM

## 2014-09-09 ENCOUNTER — Ambulatory Visit: Payer: Managed Care, Other (non HMO)

## 2014-09-13 ENCOUNTER — Other Ambulatory Visit: Payer: Self-pay | Admitting: Endocrinology

## 2014-09-13 DIAGNOSIS — E049 Nontoxic goiter, unspecified: Secondary | ICD-10-CM

## 2014-10-04 ENCOUNTER — Ambulatory Visit
Admission: RE | Admit: 2014-10-04 | Discharge: 2014-10-04 | Disposition: A | Discharge status: Home / Self Care | Payer: PRIVATE HEALTH INSURANCE | Source: Ambulatory Visit

## 2014-10-04 DIAGNOSIS — Z1231 Encounter for screening mammogram for malignant neoplasm of breast: Secondary | ICD-10-CM

## 2015-03-09 ENCOUNTER — Ambulatory Visit
Admission: RE | Admit: 2015-03-09 | Discharge: 2015-03-09 | Disposition: A | Discharge status: Home / Self Care | Payer: 59 | Source: Ambulatory Visit | Attending: Obstetrics and Gynecology | Admitting: Obstetrics and Gynecology

## 2015-03-09 ENCOUNTER — Other Ambulatory Visit: Payer: Self-pay | Admitting: Obstetrics and Gynecology

## 2015-03-09 DIAGNOSIS — N611 Abscess of the breast and nipple: Secondary | ICD-10-CM

## 2015-03-09 DIAGNOSIS — N6459 Other signs and symptoms in breast: Secondary | ICD-10-CM

## 2015-03-10 ENCOUNTER — Other Ambulatory Visit: Payer: Self-pay | Admitting: Obstetrics and Gynecology

## 2015-03-10 DIAGNOSIS — N6453 Retraction of nipple: Secondary | ICD-10-CM

## 2015-03-24 ENCOUNTER — Ambulatory Visit
Admission: RE | Admit: 2015-03-24 | Discharge: 2015-03-24 | Disposition: A | Discharge status: Home / Self Care | Payer: 59 | Source: Ambulatory Visit | Attending: Obstetrics and Gynecology | Admitting: Obstetrics and Gynecology

## 2015-03-24 DIAGNOSIS — N6453 Retraction of nipple: Secondary | ICD-10-CM

## 2015-03-24 MED ORDER — GADOBENATE DIMEGLUMINE 529 MG/ML IV SOLN: 13.0000 mL | Freq: Once | INTRAVENOUS | Status: AC | PRN

## 2015-03-28 ENCOUNTER — Other Ambulatory Visit: Payer: Self-pay | Admitting: Obstetrics and Gynecology

## 2015-03-28 DIAGNOSIS — R928 Other abnormal and inconclusive findings on diagnostic imaging of breast: Secondary | ICD-10-CM

## 2015-03-29 ENCOUNTER — Other Ambulatory Visit: Payer: Self-pay | Admitting: Obstetrics and Gynecology

## 2015-03-29 DIAGNOSIS — R928 Other abnormal and inconclusive findings on diagnostic imaging of breast: Secondary | ICD-10-CM

## 2015-03-31 ENCOUNTER — Ambulatory Visit
Admission: RE | Admit: 2015-03-31 | Discharge: 2015-03-31 | Disposition: A | Discharge status: Home / Self Care | Payer: 59 | Source: Ambulatory Visit | Attending: Obstetrics and Gynecology | Admitting: Obstetrics and Gynecology

## 2015-03-31 ENCOUNTER — Other Ambulatory Visit: Payer: Self-pay | Admitting: Obstetrics and Gynecology

## 2015-03-31 DIAGNOSIS — R928 Other abnormal and inconclusive findings on diagnostic imaging of breast: Secondary | ICD-10-CM

## 2015-03-31 MED ORDER — GADOBENATE DIMEGLUMINE 529 MG/ML IV SOLN
13.0000 mL | Freq: Once | INTRAVENOUS | Status: AC | PRN
  Administered 2015-03-31: 13 mL via INTRAVENOUS

## 2015-04-26 ENCOUNTER — Other Ambulatory Visit (HOSPITAL_COMMUNITY): Payer: Self-pay | Admitting: Physical Therapy

## 2015-04-26 ENCOUNTER — Other Ambulatory Visit: Payer: Self-pay | Admitting: Obstetrics and Gynecology

## 2015-04-26 DIAGNOSIS — N61 Mastitis without abscess: Secondary | ICD-10-CM

## 2015-05-12 ENCOUNTER — Ambulatory Visit
Admission: RE | Admit: 2015-05-12 | Discharge: 2015-05-12 | Disposition: A | Discharge status: Home / Self Care | Payer: 59 | Source: Ambulatory Visit | Attending: Obstetrics and Gynecology | Admitting: Obstetrics and Gynecology

## 2015-05-12 DIAGNOSIS — N61 Mastitis without abscess: Secondary | ICD-10-CM

## 2015-08-10 ENCOUNTER — Ambulatory Visit
Admission: RE | Admit: 2015-08-10 | Discharge: 2015-08-10 | Disposition: A | Discharge status: Home / Self Care | Payer: 59 | Source: Ambulatory Visit | Attending: Endocrinology | Admitting: Endocrinology

## 2015-08-10 DIAGNOSIS — E049 Nontoxic goiter, unspecified: Secondary | ICD-10-CM

## 2015-08-30 ENCOUNTER — Other Ambulatory Visit: Payer: Self-pay

## 2015-08-30 DIAGNOSIS — Z1231 Encounter for screening mammogram for malignant neoplasm of breast: Secondary | ICD-10-CM

## 2015-09-02 ENCOUNTER — Other Ambulatory Visit: Payer: Self-pay | Admitting: Endocrinology

## 2015-09-02 DIAGNOSIS — R5381 Other malaise: Secondary | ICD-10-CM

## 2015-09-06 ENCOUNTER — Other Ambulatory Visit: Payer: Self-pay | Admitting: Endocrinology

## 2015-09-06 DIAGNOSIS — M8580 Other specified disorders of bone density and structure, unspecified site: Secondary | ICD-10-CM

## 2015-10-13 ENCOUNTER — Ambulatory Visit
Admission: RE | Admit: 2015-10-13 | Discharge: 2015-10-13 | Disposition: A | Discharge status: Home / Self Care | Payer: 59 | Source: Ambulatory Visit | Attending: Endocrinology | Admitting: Endocrinology

## 2015-10-13 ENCOUNTER — Ambulatory Visit: Payer: 59

## 2015-10-13 ENCOUNTER — Ambulatory Visit
Admission: RE | Admit: 2015-10-13 | Discharge: 2015-10-13 | Disposition: A | Discharge status: Home / Self Care | Payer: 59 | Source: Ambulatory Visit

## 2015-10-13 DIAGNOSIS — M8580 Other specified disorders of bone density and structure, unspecified site: Secondary | ICD-10-CM

## 2015-10-13 DIAGNOSIS — Z1231 Encounter for screening mammogram for malignant neoplasm of breast: Secondary | ICD-10-CM

## 2016-05-13 IMAGING — MR MR LT BREAST BX W LOC DEV EA ADD LESION IMAGE BX SPEC MR GUIDE
7 of 9 series · 34 of 48 positions shown · IV contrast (13ml Multihance)
Comparison: Previous exams.

CLINICAL DATA: 53-year-old female with abnormal MR enhancement
within the outer left breast. For tissue sampling of anterior extent
of abnormal MR enhancement.

EXAM:
MRI GUIDED CORE NEEDLE BIOPSY OF THE LEFT BREAST
TECHNIQUE: Multiplanar, multisequence MR imaging of the left breast was
performed both before and after administration of intravenous
contrast.
CONTRAST:  13mL MULTIHANCE GADOBENATE DIMEGLUMINE 529 MG/ML IV SOLN

[Series 2: axial pre-cm · axial · non-contrast · 1.3mm · 0.73mm/px · z∈[-68,+118]mm · 6 of 144 slices shown]
[im 1/144]
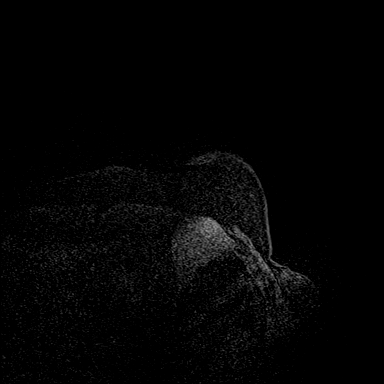
[im 29/144]
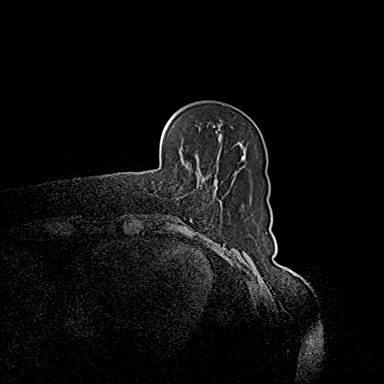
[im 58/144]
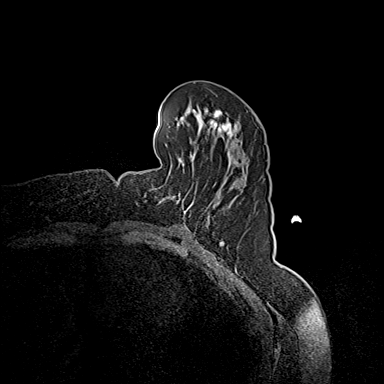
[im 86/144]
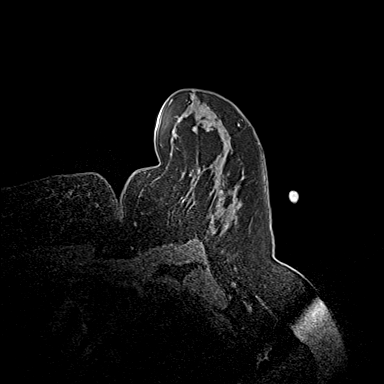
[im 115/144]
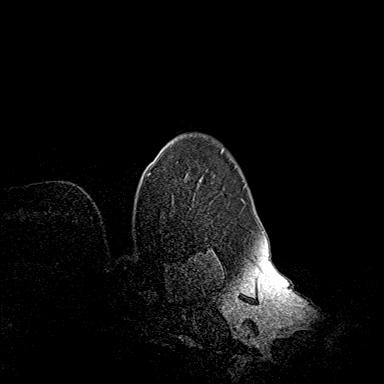
[im 144/144]
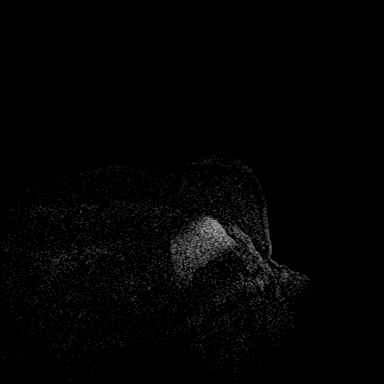

[Series 3: axial pre-cm_s2_(id) · axial · non-contrast · 1.3mm · 0.73mm/px · z∈[-68,+118]mm · 6 of 144 slices shown]
[im 1/144]
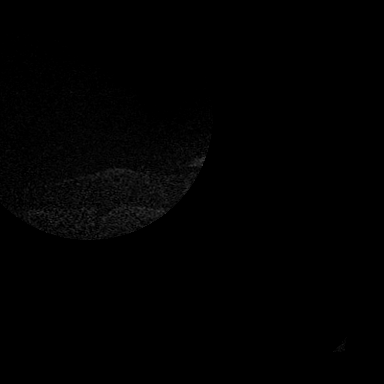
[im 29/144]
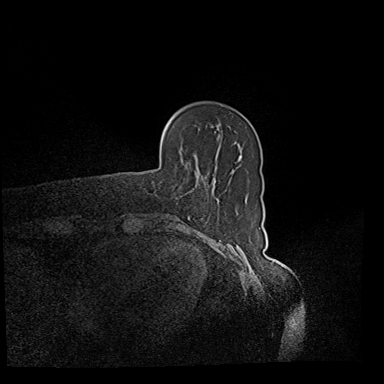
[im 58/144]
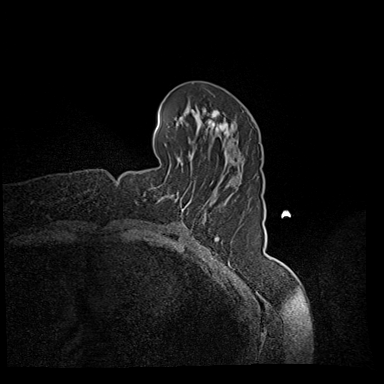
[im 86/144]
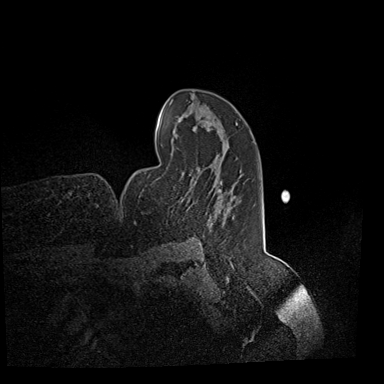
[im 115/144]
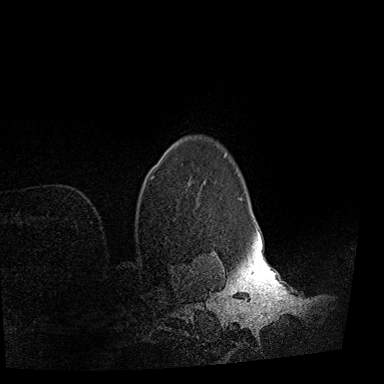
[im 144/144]
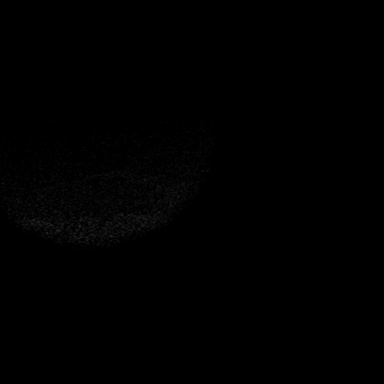

[Series 4: axial post 20 · axial · 1.3mm · 0.73mm/px · z∈[-68,+118]mm · 6 of 144 slices shown (1 of 3)]
[im 1/144]
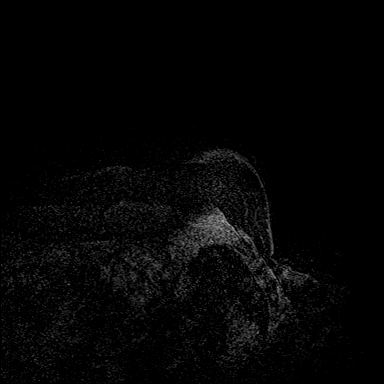
[im 29/144]
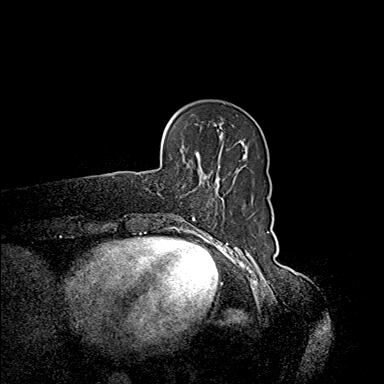
[im 58/144]
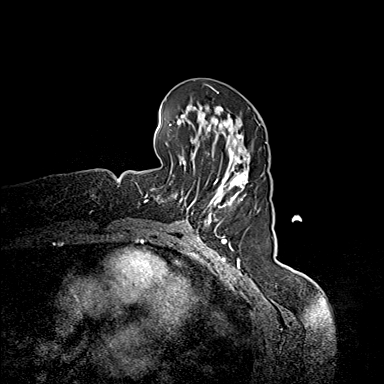
[im 86/144]
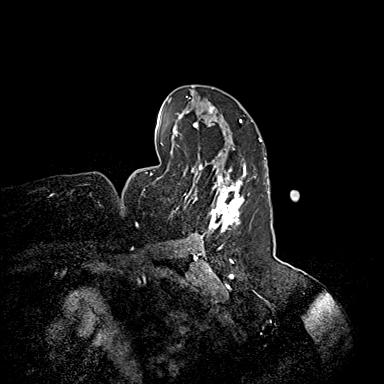
[im 115/144]
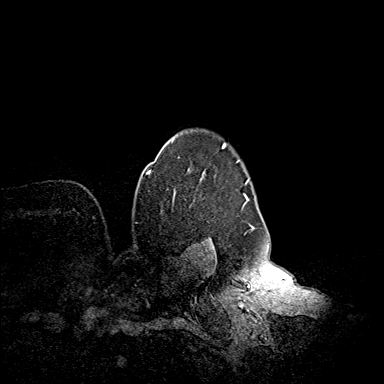
[im 144/144]
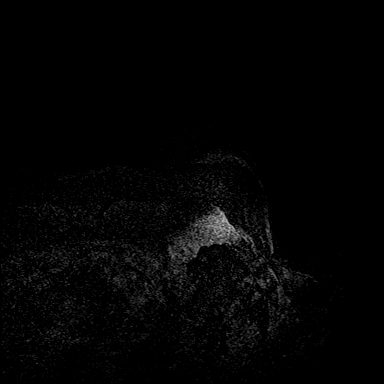

[Series 5: axial post 20 · axial · 1.3mm · 0.73mm/px · z∈[-68,+118]mm · 5 of 144 slices shown (2 of 3)]
[im 1/144]
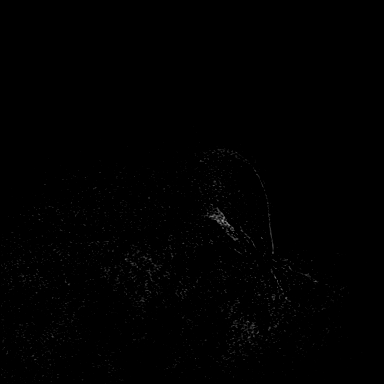
[im 36/144]
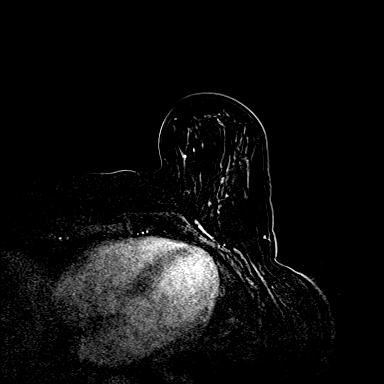
[im 72/144]
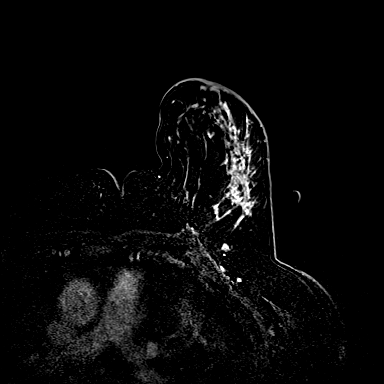
[im 108/144]
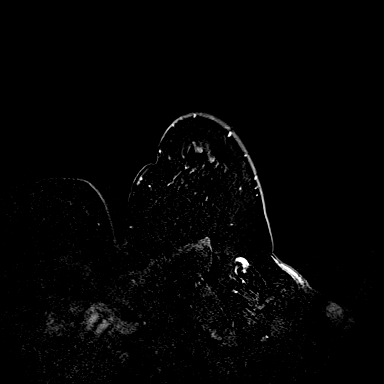
[im 144/144]
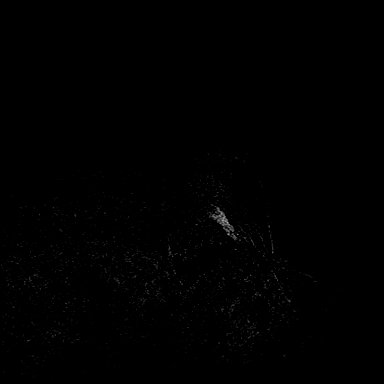

[Series 6: axial post 20 · axial · 1.3mm · 0.73mm/px · z∈[-68,+118]mm · 5 of 144 slices shown (3 of 3)]
[im 1/144]
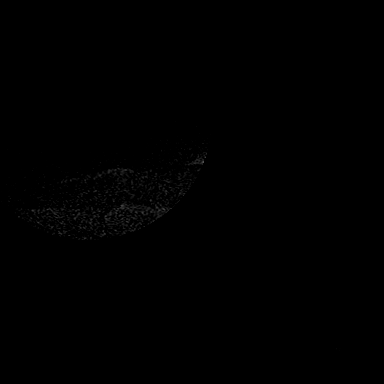
[im 36/144]
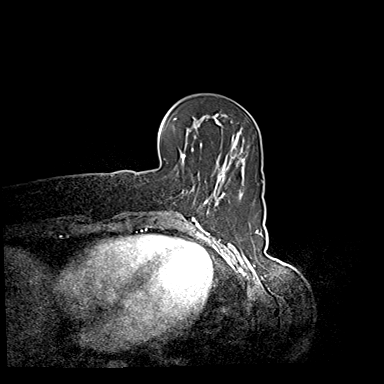
[im 72/144]
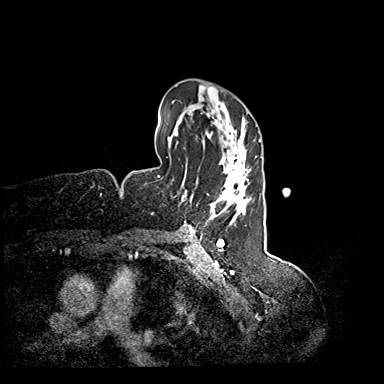
[im 108/144]
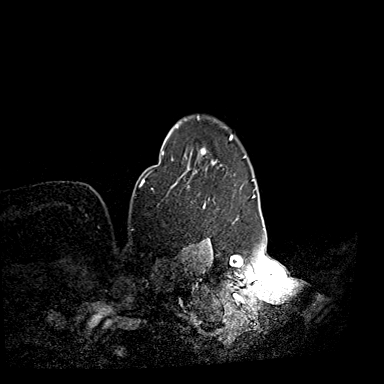
[im 144/144]
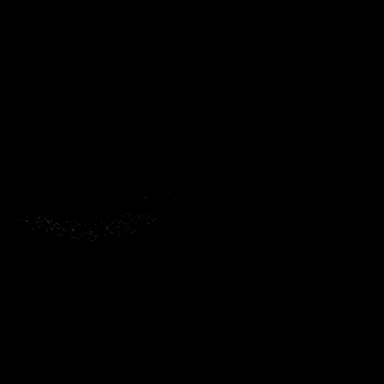

[Series 7: axial confirmation · axial · 1.3mm · 0.73mm/px · z∈[-68,+118]mm · 5 of 144 slices shown]
[im 1/144]
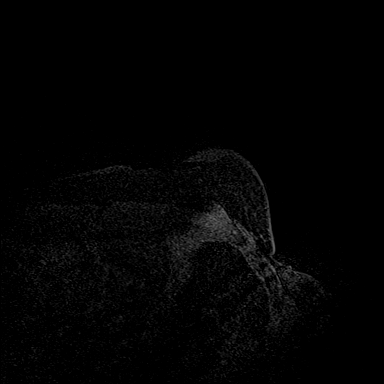
[im 36/144]
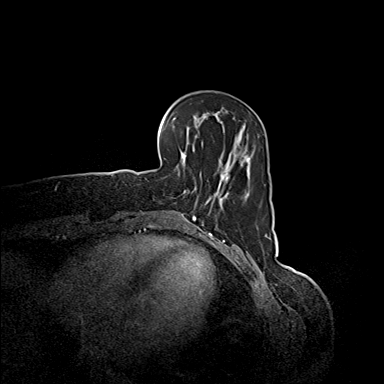
[im 72/144]
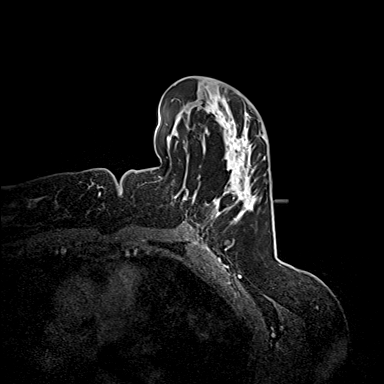
[im 108/144]
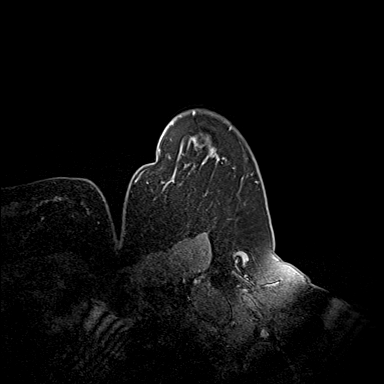
[im 144/144]
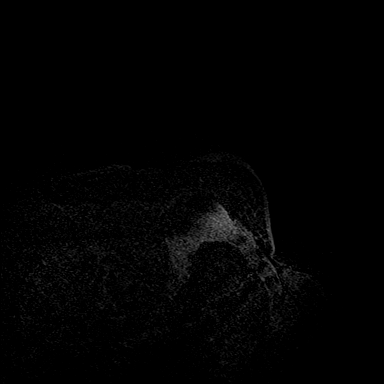

[Series 8: axial confirmation_sub · axial · 1.3mm · 0.73mm/px · 1 of 144 slices shown]
[im 1/144]
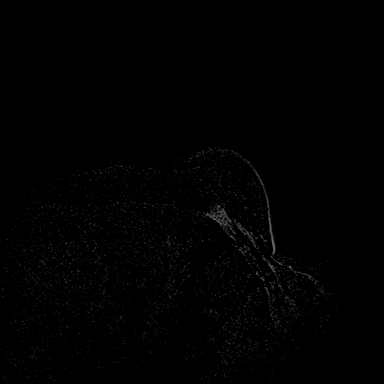

[34 of 48 positions shown; findings below may reference images not displayed]

FINDINGS: I met with the patient, and we discussed the procedure of MRI guided
biopsy, including risks, benefits, and alternatives. Specifically,
we discussed the risks of infection, bleeding, tissue injury, clip
migration, and inadequate sampling. Informed, written consent was
given. The usual time out protocol was performed immediately prior
to the procedure.

Using sterile technique, 2% Lidocaine, MRI guidance, and a 9 gauge
vacuum assisted device, biopsy was performed of the anterior extent
of abnormal MR enhancement within the outer left breast using a
lateral approach. At the conclusion of the procedure, a
cylinder-shaped tissue marker clip was deployed into the biopsy
cavity. Follow-up 2-view mammogram was performed and dictated
separately.
IMPRESSION: MRI guided biopsy of anterior extent of abnormal MR enhancement
within the outer left breast. No apparent complications.

Pathology will be followed.

## 2016-08-20 ENCOUNTER — Other Ambulatory Visit: Payer: Self-pay | Admitting: Family Medicine

## 2016-08-21 ENCOUNTER — Other Ambulatory Visit: Payer: Self-pay | Admitting: Physician Assistant

## 2016-08-21 DIAGNOSIS — R292 Abnormal reflex: Secondary | ICD-10-CM

## 2016-08-21 DIAGNOSIS — M5416 Radiculopathy, lumbar region: Secondary | ICD-10-CM

## 2016-08-21 DIAGNOSIS — M5412 Radiculopathy, cervical region: Secondary | ICD-10-CM

## 2016-09-01 ENCOUNTER — Ambulatory Visit
Admission: RE | Admit: 2016-09-01 | Discharge: 2016-09-01 | Disposition: A | Discharge status: Home / Self Care | Payer: 59 | Source: Ambulatory Visit | Attending: Physician Assistant | Admitting: Physician Assistant

## 2016-09-01 ENCOUNTER — Other Ambulatory Visit: Payer: Self-pay | Admitting: Physician Assistant

## 2016-09-01 DIAGNOSIS — R292 Abnormal reflex: Secondary | ICD-10-CM

## 2016-09-01 DIAGNOSIS — M5412 Radiculopathy, cervical region: Secondary | ICD-10-CM

## 2016-09-01 DIAGNOSIS — M5416 Radiculopathy, lumbar region: Secondary | ICD-10-CM

## 2016-10-31 ENCOUNTER — Other Ambulatory Visit: Payer: Self-pay | Admitting: Obstetrics and Gynecology

## 2016-10-31 DIAGNOSIS — Z1231 Encounter for screening mammogram for malignant neoplasm of breast: Secondary | ICD-10-CM

## 2016-11-27 ENCOUNTER — Ambulatory Visit
Admission: RE | Admit: 2016-11-27 | Discharge: 2016-11-27 | Disposition: A | Discharge status: Home / Self Care | Payer: 59 | Source: Ambulatory Visit | Attending: Obstetrics and Gynecology | Admitting: Obstetrics and Gynecology

## 2016-11-27 DIAGNOSIS — Z1231 Encounter for screening mammogram for malignant neoplasm of breast: Secondary | ICD-10-CM

## 2017-07-30 ENCOUNTER — Other Ambulatory Visit: Payer: Self-pay | Admitting: Endocrinology

## 2017-07-30 DIAGNOSIS — E049 Nontoxic goiter, unspecified: Secondary | ICD-10-CM

## 2017-07-31 ENCOUNTER — Other Ambulatory Visit: Payer: Self-pay | Admitting: Endocrinology

## 2017-07-31 DIAGNOSIS — R5381 Other malaise: Secondary | ICD-10-CM

## 2017-08-06 ENCOUNTER — Other Ambulatory Visit: Payer: Self-pay | Admitting: Endocrinology

## 2017-08-06 DIAGNOSIS — E2839 Other primary ovarian failure: Secondary | ICD-10-CM

## 2017-08-09 ENCOUNTER — Other Ambulatory Visit: Payer: Self-pay | Admitting: Endocrinology

## 2017-08-09 DIAGNOSIS — Z1231 Encounter for screening mammogram for malignant neoplasm of breast: Secondary | ICD-10-CM

## 2017-08-21 ENCOUNTER — Ambulatory Visit
Admission: RE | Admit: 2017-08-21 | Discharge: 2017-08-21 | Disposition: A | Discharge status: Home / Self Care | Payer: 59 | Source: Ambulatory Visit | Attending: Endocrinology | Admitting: Endocrinology

## 2017-08-21 DIAGNOSIS — E049 Nontoxic goiter, unspecified: Secondary | ICD-10-CM

## 2017-11-29 ENCOUNTER — Ambulatory Visit
Admission: RE | Admit: 2017-11-29 | Discharge: 2017-11-29 | Disposition: A | Discharge status: Home / Self Care | Payer: 59 | Source: Ambulatory Visit | Attending: Endocrinology | Admitting: Endocrinology

## 2017-11-29 DIAGNOSIS — E2839 Other primary ovarian failure: Secondary | ICD-10-CM

## 2017-11-29 DIAGNOSIS — Z1231 Encounter for screening mammogram for malignant neoplasm of breast: Secondary | ICD-10-CM

## 2018-07-25 ENCOUNTER — Ambulatory Visit: Payer: Self-pay | Admitting: Mental Health

## 2018-08-15 ENCOUNTER — Other Ambulatory Visit: Payer: Self-pay | Admitting: Psychiatry

## 2018-08-16 LAB — HEMOGLOBIN A1C
HEMOGLOBIN A1C: 5.2 %{Hb} (ref <5.7)
Mean Plasma Glucose: 103 (calc)
eAG (mmol/L): 5.7 (calc)

## 2018-08-16 LAB — LIPID PANEL
CHOL/HDL RATIO: 3.2 (calc) (ref <5.0)
Cholesterol: 200 mg/dL — ABNORMAL HIGH (ref <200)
HDL: 62 mg/dL (ref >50)
LDL CHOLESTEROL (CALC): 112 mg/dL — AB
NON-HDL CHOLESTEROL (CALC): 138 mg/dL — AB (ref <130)
Triglycerides: 145 mg/dL (ref <150)

## 2018-08-19 DIAGNOSIS — F39 Unspecified mood [affective] disorder: Secondary | ICD-10-CM

## 2018-08-21 ENCOUNTER — Ambulatory Visit: Payer: 59 | Admitting: Mental Health

## 2018-08-21 DIAGNOSIS — F39 Unspecified mood [affective] disorder: Secondary | ICD-10-CM

## 2018-08-21 NOTE — Progress Notes (Signed)
      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   Mental Status Exam:    Appearance:   Neat     Behavior:  Appropriate  Motor:  Normal  Speech/Language:   Clear and Coherent  Affect:  positive  Mood:  euthymic  Thought process:  normal  Thought content:    WNL  Sensory/Perceptual disturbances:    WNL  Orientation:  oriented to person, time and place  Attention:  Good  Concentration:  Good  Memory:  WNL  Fund of knowledge:   Good  Insight:    Good  Judgment:   Good  Impulse Control:  Good     Risk Assessment: Danger to Self:  No Self-injurious Behavior: No Danger to Others: No Duty to Warn:no Physical Aggression / Violence:No  Access to Firearms a concern: No  Gang Involvement:No    Subjective: Doing well. Stable mood. Enjoying life. Enjoys Program[employer], systems. Marital satisfaction good.    Interventions: Cognitive Behavioral Therapy, Interpersonal and supportive   Diagnosis:   ICD-10-CM   1. Mild mood disorder ([place]) F39      Plan:  Maintain stable mood             Continue self care program            Work/life balance            Return to office in 2 months   Rosary Lively, [facility]

## 2018-09-02 ENCOUNTER — Ambulatory Visit: Payer: 59 | Admitting: Psychiatry

## 2018-09-02 ENCOUNTER — Other Ambulatory Visit: Payer: Self-pay | Admitting: Endocrinology

## 2018-09-02 DIAGNOSIS — F3176 Bipolar disorder, in full remission, most recent episode depressed: Secondary | ICD-10-CM

## 2018-09-02 DIAGNOSIS — E049 Nontoxic goiter, unspecified: Secondary | ICD-10-CM

## 2018-09-02 MED ORDER — SERTRALINE HCL 100 MG PO TABS: 150.0000 mg | ORAL_TABLET | Freq: Every day | ORAL | 2 refills | Status: DC

## 2018-09-02 MED ORDER — BUPROPION HCL ER (XL) 300 MG PO TB24: 300.0000 mg | ORAL_TABLET | Freq: Every day | ORAL | 2 refills | Status: DC

## 2018-09-02 MED ORDER — TRAZODONE HCL 50 MG PO TABS: 150.0000 mg | ORAL_TABLET | Freq: Every day | ORAL | 3 refills | Status: DC

## 2018-09-02 MED ORDER — PERPHENAZINE 8 MG PO TABS: 8.0000 mg | ORAL_TABLET | Freq: Every day | ORAL | 2 refills | Status: DC

## 2018-09-02 NOTE — Progress Notes (Signed)
[facility] Med Check  Patient ID: Chloe Dennis,  MRN: [id number]  PCP: Lawerance Cruel, MD  Date of Evaluation: 09/02/2018 Time spent:20 minutes  Chief Complaint:   HISTORY/CURRENT STATUS: HPI patient is a 57 year old white female with diagnoses of bipolar disorder.  She was last seen August of this year.  She was doing well. She continues to do well.  Individual Medical History/ Review of Systems: Changes? Elevated lipids 10/19. Increase since 2017.  Allergies: Penicillins  Current Medications:  Current Outpatient Medications:  .  buPROPion (WELLBUTRIN XL) 300 MG 24 hr tablet, Take 1 tablet (300 mg total) by mouth daily., Disp: 30 tablet, Rfl: 2 .  perphenazine (TRILAFON) 8 MG tablet, Take 1 tablet (8 mg total) by mouth daily., Disp: 30 tablet, Rfl: 2 .  sertraline (ZOLOFT) 100 MG tablet, Take 1.5 tablets (150 mg total) by mouth daily., Disp: 45 tablet, Rfl: 2 .  traZODone (DESYREL) 50 MG tablet, Take 3 tablets (150 mg total) by mouth at bedtime., Disp: 90 tablet, Rfl: 3 .  feeding supplement (ENSURE CLINICAL STRENGTH) LIQD, Take 237 mLs by mouth 3 (three) times daily with meals., Disp: 12 Bottle, Rfl: 4 .  LamoTRIgine (LAMICTAL PO), Take by mouth. , Disp: , Rfl:  .  Pediatric Multiple Vit-C-FA (MULTIVITAMIN ANIMAL SHAPES, WITH CA/FA,) WITH C & FA CHEW, Chew 2 tablets by mouth daily., Disp: 60 tablet, Rfl: 11 Medication Side Effects: none  Family Medical/ Social History: Changes? Son separated from his wife. Lives in [place].  MENTAL HEALTH EXAM:  Last menstrual period 08/09/2011.There is no height or weight on file to calculate BMI.  General Appearance: Casual  Eye Contact:  Good  Speech:  Clear and Coherent  Volume:  Normal  Mood:  Euthymic  Affect:  Appropriate  Thought Process:  Linear  Orientation:  Full (Time, Place, and Person)  Thought Content: Logical   Suicidal Thoughts:  No  Homicidal Thoughts:  No  Memory:  WNL  Judgement:  Good  Insight:  Good   Psychomotor Activity:  Normal  Concentration:  Concentration: Good  Recall:  Good  Fund of Knowledge: Good  Language: Good  Assets:  Social Support  ADL's:  Intact  Cognition: WNL  Prognosis:  Good    DIAGNOSES:    ICD-10-CM   1. Bipolar disorder, in full remission, most recent episode depressed ([place]) F31.76     Receiving Psychotherapy: No    RECOMMENDATIONS: We will continue to change current medicine regimen.  She takes Trilafon 8 mg at bedtime.  Wellbutrin XL 300 mg a day, Zoloft 150 mg a day, trazodone 50 mg 3 at bedtime.  She has had an increase in her lipids lab.  I will send a copy of October through her family physician.  At present she wants to stay on the Trilafon and will talk with her family doctor about it.  If not done before that we will check her lipids in January or February of next year return in 3 months   [employer], [place]

## 2018-09-15 ENCOUNTER — Ambulatory Visit
Admission: RE | Admit: 2018-09-15 | Discharge: 2018-09-15 | Disposition: A | Discharge status: Home / Self Care | Payer: 59 | Source: Ambulatory Visit | Attending: Endocrinology | Admitting: Endocrinology

## 2018-09-15 DIAGNOSIS — E049 Nontoxic goiter, unspecified: Secondary | ICD-10-CM

## 2018-09-23 ENCOUNTER — Ambulatory Visit: Payer: 59 | Admitting: Mental Health

## 2018-10-13 ENCOUNTER — Encounter: Payer: Self-pay | Admitting: Mental Health

## 2018-10-13 ENCOUNTER — Ambulatory Visit: Payer: 59 | Admitting: Mental Health

## 2018-10-13 DIAGNOSIS — F39 Unspecified mood [affective] disorder: Secondary | ICD-10-CM

## 2018-10-13 NOTE — Progress Notes (Signed)
      [facility] Counselor/Therapist Progress Note  Patient ID: TYA HAUGHEY, MRN: [id number],    Date: 10/13/2018  Time Spent: 45 minutes  Treatment Type: individual  Reported Symptoms: Mood stable, polite and cooperative, sleeping well.  Mental Status Exam:  Appearance:   Well Groomed     Behavior:  Appropriate and Sharing  Motor:  Normal  Speech/Language:   Clear and Coherent  Affect:  Appropriate  Mood:  stable  Thought process:  normal  Thought content:    WNL  Sensory/Perceptual disturbances:    WNL  Orientation:  oriented to person, place and time/date  Attention:  Good  Concentration:  Good  Memory:  WNL  Fund of knowledge:   Good  Insight:    Good  Judgment:   Good  Impulse Control:  Good   Risk Assessment: Danger to Self:  No Self-injurious Behavior: No Danger to Others: No Duty to Warn:no Physical Aggression / Violence:No  Access to Firearms a concern: No  Gang Involvement:No   Subjective:  Stable mood, enjoys her work. Marriage satisfaction high. Just got a new dog.  Interventions: Cognitive Behavioral Therapy, Solution-Oriented/Positive Psychology, Insight-Oriented and Interpersonal  Diagnosis:     ICD-10-CM   1. Mild mood disorder ([place]) F39     Plan: Maintain stable mood           Good self care continued           Work/life balance           Validation and suppport  Rosary Lively, LPC

## 2018-10-13 NOTE — Progress Notes (Deleted)
      [facility] Counselor/Therapist Progress Note  Patient ID: Chloe Dennis, MRN: [id number],    Date: 10/13/2018  Time Spent: ***   Treatment Type: {CHL AMB THERAPY [phone]}  Reported Symptoms: {CHL AMB [facility] COUNSELOR SYMP LIST:22089}  Mental Status Exam:  Appearance:   {PSY:22683}     Behavior:  {PSY:21022743}  Motor:  {PSY:22302}  Speech/Language:   {PSY:22685}  Affect:  {PSY:22687}  Mood:  {PSY:31886}  Thought process:  {PSY:31888}  Thought content:    {PSY:[phone]}  Sensory/Perceptual disturbances:    {PSY:5730658211}  Orientation:  {PSY:30297}  Attention:  {PSY:22877}  Concentration:  {PSY:249 462 5476}  Memory:  {PSY:2767977012}  Fund of knowledge:   {PSY:249 462 5476}  Insight:    {PSY:249 462 5476}  Judgment:   {PSY:249 462 5476}  Impulse Control:  {PSY:249 462 5476}   Risk Assessment: Danger to Self:  {PSY:22692} Self-injurious Behavior: {PSY:22692} Danger to Others: {PSY:22692} Duty to Warn:{PSY:311194} Physical Aggression / Violence:{PSY:21197} Access to Firearms a concern: {PSY:21197} Gang Involvement:{PSY:21197}  Subjective: ***   Interventions: {PSY:402 611 7137}  Diagnosis:   ICD-10-CM   1. Mild mood disorder ([facility]) F39     Plan: ***  Chloe Dennis, LPC

## 2018-12-15 ENCOUNTER — Ambulatory Visit: Payer: 59 | Admitting: Mental Health

## 2018-12-15 ENCOUNTER — Encounter: Payer: Self-pay | Admitting: Mental Health

## 2018-12-15 DIAGNOSIS — F39 Unspecified mood [affective] disorder: Secondary | ICD-10-CM | POA: Diagnosis not present

## 2018-12-15 NOTE — Progress Notes (Signed)
      [facility] Counselor/Therapist Progress Note  Patient ID: Chloe Dennis, MRN: [id number],    Date: 12/15/2018  Time Spent: 45 minutes  Treatment Type: Individual Therapy  Reported Symptoms: Sorrowful over having to  put her dog down. Sleeping well. Appetite normal. Not worrying about things now.  Mental Status Exam:  Appearance:   Casual     Behavior:  Appropriate  Motor:  normal  Speech/Language:   Normal Rate  Affect:  sad  Mood:  normal  Thought process:  goal directed  Thought content:    WNL  Sensory/Perceptual disturbances:    WNL  Orientation:  oriented to person, place and time/date  Attention:  Good  Concentration:  Good  Memory:  WNL  Fund of knowledge:   Good  Insight:    Good  Judgment:   Good  Impulse Control:  Good   Risk Assessment: Danger to Self:  No Self-injurious Behavior: No Danger to Others: No Duty to Warn:no Physical Aggression / Violence:No  Access to Firearms a concern: No  Gang Involvement:No   Subjective: Has had to put down her dog in January and had a dog die in December. Sad about it. 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 Appetite normal. Sleeping well.  Interventions: Cognitive Behavioral Therapy, Solution-Oriented/Positive Psychology, Insight-Oriented and Interpersonal  Diagnosis:   ICD-10-CM   1. Moderate mood disorder ([facility]) F39     Plan: Maintain mood           Self care program           Work/Life balance           Validation and support  Rosary Lively, [facility]

## 2018-12-23 ENCOUNTER — Other Ambulatory Visit: Payer: Self-pay | Admitting: Psychiatry

## 2018-12-23 DIAGNOSIS — F319 Bipolar disorder, unspecified: Secondary | ICD-10-CM

## 2018-12-24 LAB — LIPID PANEL
CHOL/HDL RATIO: 3 (calc) (ref <5.0)
CHOLESTEROL: 157 mg/dL (ref <200)
HDL: 52 mg/dL (ref >=50)
LDL CHOLESTEROL (CALC): 82 mg/dL
Non-HDL Cholesterol (Calc): 105 mg/dL (calc) (ref <130)
TRIGLYCERIDES: 134 mg/dL (ref <150)

## 2018-12-30 ENCOUNTER — Ambulatory Visit (INDEPENDENT_AMBULATORY_CARE_PROVIDER_SITE_OTHER): Payer: 59 | Admitting: Psychiatry

## 2018-12-30 DIAGNOSIS — F3130 Bipolar disorder, current episode depressed, mild or moderate severity, unspecified: Secondary | ICD-10-CM

## 2018-12-30 MED ORDER — SERTRALINE HCL 100 MG PO TABS: 150.0000 mg | ORAL_TABLET | Freq: Every day | ORAL | 4 refills | Status: DC

## 2018-12-30 MED ORDER — PERPHENAZINE 8 MG PO TABS: 8.0000 mg | ORAL_TABLET | Freq: Every day | ORAL | 4 refills | Status: DC

## 2018-12-30 MED ORDER — BUPROPION HCL ER (XL) 300 MG PO TB24: 300.0000 mg | ORAL_TABLET | Freq: Every day | ORAL | 4 refills | Status: DC

## 2018-12-30 MED ORDER — TRAZODONE HCL 50 MG PO TABS: 150.0000 mg | ORAL_TABLET | Freq: Every day | ORAL | 3 refills | Status: DC

## 2018-12-30 NOTE — Progress Notes (Signed)
[facility] Med Check  Patient ID: Chloe Dennis,  MRN: [id number]  PCP: Lawerance Cruel, MD  Date of Evaluation: 12/30/2018 Time spent:20 minutes  Chief Complaint:   HISTORY/CURRENT STATUS: HPI patient seen 09/02/2018 and doing well. Continues to do well. Lipids today look better.  Individual Medical History/ Review of Systems: Changes? :No   Allergies: Penicillins  Current Medications:  Current Outpatient Medications:  .  buPROPion (WELLBUTRIN XL) 300 MG 24 hr tablet, Take 1 tablet (300 mg total) by mouth daily., Disp: 30 tablet, Rfl: 2 .  feeding supplement (ENSURE CLINICAL STRENGTH) LIQD, Take 237 mLs by mouth 3 (three) times daily with meals., Disp: 12 Bottle, Rfl: 4 .  LamoTRIgine (LAMICTAL PO), Take by mouth. , Disp: , Rfl:  .  Pediatric Multiple Vit-C-FA (MULTIVITAMIN ANIMAL SHAPES, WITH CA/FA,) WITH C & FA CHEW, Chew 2 tablets by mouth daily., Disp: 60 tablet, Rfl: 11 .  perphenazine (TRILAFON) 8 MG tablet, Take 1 tablet (8 mg total) by mouth daily., Disp: 30 tablet, Rfl: 2 .  sertraline (ZOLOFT) 100 MG tablet, Take 1.5 tablets (150 mg total) by mouth daily., Disp: 45 tablet, Rfl: 2 .  traZODone (DESYREL) 50 MG tablet, Take 3 tablets (150 mg total) by mouth at bedtime., Disp: 90 tablet, Rfl: 3 Medication Side Effects: none  Family Medical/ Social History: Changes? no  MENTAL HEALTH EXAM:  Last menstrual period 08/09/2011.There is no height or weight on file to calculate BMI.  General Appearance: Casual  Eye Contact:  good  Speech:  Normal Rate  Volume:  Normal  Mood:  Euthymic  Affect:  Appropriate  Thought Process:  Linear  Orientation:  Full (Time, Place, and Person)  Thought Content: Logical   Suicidal Thoughts:  No  Homicidal Thoughts:  No  Memory:  WNL  Judgement:  Good  Insight:  Good  Psychomotor Activity:  Normal  Concentration:  Concentration: Good  Recall:  Good  Fund of Knowledge: Good  Language: Good  Assets:  Desire for Improvement   ADL's:  Intact  Cognition: WNL  Prognosis:  Good    DIAGNOSES: No diagnosis found.  Receiving Psychotherapy: Yes carson sarvis   RECOMMENDATIONS: lipids sent to PCP  is continuing to do well and I am impressed with her improvement in her lipids and cholesterol.  He does still have some values that are elevated I am sending a copy this report to her family physician.  If need be we can change her Trilafon but right now I would rather her see her PCP about it. Recheck 4 months.   Comer Locket, PA-C

## 2019-02-13 ENCOUNTER — Ambulatory Visit: Payer: 59 | Admitting: Mental Health

## 2019-03-30 ENCOUNTER — Ambulatory Visit: Payer: 59 | Admitting: Mental Health

## 2019-03-30 ENCOUNTER — Encounter: Payer: Self-pay | Admitting: Mental Health

## 2019-03-30 ENCOUNTER — Other Ambulatory Visit: Payer: Self-pay

## 2019-03-30 DIAGNOSIS — F3170 Bipolar disorder, currently in remission, most recent episode unspecified: Secondary | ICD-10-CM

## 2019-03-30 NOTE — Progress Notes (Signed)
[facility] Counselor/Therapist Progress Note  Patient ID: GENESYS COGGESHALL, MRN: [id number],    Date: 03/30/2019  Time Spent: 50 minutes  Treatment Type: Individual Therapy  Reported Symptoms: Mood stable. Feels situations of Corona Virus as well as looting and riots has been disorienting and uncomfortable but denies depression or excessive anxiety. Things seem "surreal." Upbeat and positive that will have the opportunity to go to [place] to visit her son in September. Polite and cooperative. Open and transparent.  Mental Status Exam:  Appearance:   Casual     Behavior:  Appropriate and Sharing  Motor:  Normal  Speech/Language:   Clear and Coherent  Affect:  Appropriate and Congruent  Mood:  euthymic  Thought process:  normal  Thought content:    WNL  Sensory/Perceptual disturbances:    WNL  Orientation:  oriented to person, place and time/date  Attention:  Good  Concentration:  Good  Memory:  WNL  Fund of knowledge:   Good  Insight:    Good  Judgment:   Good  Impulse Control:  Good   Risk Assessment: Danger to Self:  No Self-injurious Behavior: No Danger to Others: No Duty to Warn:no Physical Aggression / Violence:No  Access to Firearms a concern: No  Gang Involvement:No   Subjective: 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 Job at [employer] ongoing. Life seems "surreal." Denies depression. Talkative. Mood positive and positively anticipatory.   Interventions: Solution-Oriented/Positive Psychology, Insight-Oriented, Family Systems, Interpersonal and supportive  Diagnosis:   ICD-10-CM   1. Bipolar affective disorder in remission [facility]) F31.70       Treatment Plan   Patient Name: Acasia Skilton   Date: March 30, 2019   Didactic topic to be discussed:           Anxiety:                   Locus of control                              Work/Life balance           Depression                              Problem-solving                              Relationships                                   Boundaries                                     Coping srategies                             Communication                    Recovery from trauma                    Self-care  Validation  Other     Goals:  Patient  1. Maintains mood stabiity:  decreased symptoms of     depression     anxiety  2.   Practices pro-active self-care:   restful sleep, nutrition, exercise, socialization  3.   Effective utilizes boundaries and sets limits  4.   Utliizes coping strategies and problem solving techniques for stress management  5.   Feels accurately heard, understood and validated  Other      Logan Bores Cable, [facility]

## 2019-04-28 ENCOUNTER — Ambulatory Visit: Payer: 59 | Admitting: Psychiatry

## 2019-06-02 ENCOUNTER — Other Ambulatory Visit: Payer: Self-pay | Admitting: Nurse Practitioner

## 2019-10-11 ENCOUNTER — Other Ambulatory Visit: Payer: Self-pay

## 2019-10-11 MED ORDER — TRAZODONE HCL 50 MG PO TABS: 150.0000 mg | ORAL_TABLET | Freq: Every day | ORAL | 0 refills | Status: DC

## 2019-10-19 ENCOUNTER — Encounter: Payer: Self-pay | Admitting: Adult Health

## 2019-10-19 ENCOUNTER — Ambulatory Visit (INDEPENDENT_AMBULATORY_CARE_PROVIDER_SITE_OTHER): Payer: 59 | Admitting: Adult Health

## 2019-10-19 ENCOUNTER — Other Ambulatory Visit: Payer: Self-pay

## 2019-10-19 DIAGNOSIS — F411 Generalized anxiety disorder: Secondary | ICD-10-CM | POA: Diagnosis not present

## 2019-10-19 DIAGNOSIS — F410 Panic disorder [episodic paroxysmal anxiety] without agoraphobia: Secondary | ICD-10-CM

## 2019-10-19 DIAGNOSIS — F3170 Bipolar disorder, currently in remission, most recent episode unspecified: Secondary | ICD-10-CM | POA: Diagnosis not present

## 2019-10-19 DIAGNOSIS — G4700 Insomnia, unspecified: Secondary | ICD-10-CM | POA: Diagnosis not present

## 2019-10-19 DIAGNOSIS — F331 Major depressive disorder, recurrent, moderate: Secondary | ICD-10-CM

## 2019-10-19 MED ORDER — PERPHENAZINE 8 MG PO TABS
8.0000 mg | ORAL_TABLET | Freq: Every day | ORAL | 2 refills | Status: DC
Start: 2019-10-19 — End: 2020-01-18

## 2019-10-19 MED ORDER — TRAZODONE HCL 50 MG PO TABS: 150.0000 mg | ORAL_TABLET | Freq: Every day | ORAL | 2 refills | Status: DC

## 2019-10-19 MED ORDER — BUPROPION HCL ER (XL) 300 MG PO TB24: 300.0000 mg | ORAL_TABLET | Freq: Every day | ORAL | 2 refills | Status: DC

## 2019-10-19 MED ORDER — SERTRALINE HCL 100 MG PO TABS: 150.0000 mg | ORAL_TABLET | Freq: Every day | ORAL | 2 refills | Status: DC

## 2019-10-19 NOTE — Progress Notes (Signed)
Chloe Dennis [id number] 1961-04-18 58 y.o.  Subjective:   Patient ID:  Chloe Dennis is a 58 y.o. (DOB 09/07/1961) female.  Chief Complaint:  Chief Complaint  Patient presents with  . Insomnia  . Depression  . Anxiety  . Panic Attack  . Other    Bipolar Disorder    HPI Chloe Dennis presents to the office today for follow-up of   Describes mood today as "ok". Pleasant.  Mood symptoms - depression, anxiety, and irritability. Stating "I'm doing pretty good". Has been taking current medication for a "while" and feels like it works well for her. Started having issues during "menopause". Stating "it took a while, but I am doing good now". Stable interest and motivation. Taking medications as prescribed.  Energy levels stable. Active, does not have a regular exercise routine.  Enjoys some usual interests and activities. Married. Lives with husband of 14 years. Has a son from first marriage - 14 - lives in [place]. No family local - brothers and sisters in [place]. Appetite adequate. Weight gain. Sleeps well most nights. Averages 6 to 8 hours. Focus and concentration stable. Completing tasks. Managing aspects of household. Works part-time at [employer]. Denies [place] or [place]. Denies AH or VH.  Review of Systems:  Review of Systems  Musculoskeletal: Negative for gait problem.  Neurological: Negative for tremors.  Psychiatric/Behavioral:       Please refer to HPI    Medications: I have reviewed the patient's current medications.  Current Outpatient Medications  Medication Sig Dispense Refill  . buPROPion (WELLBUTRIN XL) 300 MG 24 hr tablet Take 1 tablet (300 mg total) by mouth daily. 30 tablet 2  . lisinopril (ZESTRIL) 20 MG tablet Take 20 mg by mouth daily.    Marland Kitchen perphenazine (TRILAFON) 8 MG tablet Take 1 tablet (8 mg total) by mouth daily. 30 tablet 2  . sertraline (ZOLOFT) 100 MG tablet Take 1.5 tablets (150 mg total) by mouth daily. 45 tablet 2  . simvastatin (ZOCOR) 20 MG tablet  Take 20 mg by mouth at bedtime.    . traZODone (DESYREL) 50 MG tablet Take 3 tablets (150 mg total) by mouth at bedtime. 90 tablet 2  . valACYclovir (VALTREX) 500 MG tablet Take by mouth.     No current facility-administered medications for this visit.    Medication Side Effects: None  Allergies:  Allergies  Allergen Reactions  . Penicillins Hives    whelps    Past Medical History:  Diagnosis Date  . Anemia    03/2011  . Anxiety    no meds  . Blood transfusion    2012 in [facility]  . Depression    no meds, bipolar no meds    Family History  Problem Relation Age of Onset  . Breast cancer Sister 84    Social History   Socioeconomic History  . Marital status: Married    Spouse name: Not on file  . Number of children: Not on file  . Years of education: Not on file  . Highest education level: Not on file  Occupational History  . Not on file  Tobacco Use  . Smoking status: Never Smoker  . Smokeless tobacco: Never Used  Substance and Sexual Activity  . Alcohol use: No  . Drug use: No  . Sexual activity: Yes    Partners: Male    Birth control/protection: None    Comment: married  Other Topics Concern  . Not on file  Social History  Narrative  . Not on file   Social Determinants of Health   Financial Resource Strain:   . Difficulty of Paying Living Expenses: Not on file  Food Insecurity:   . Worried About Charity fundraiser in the Last Year: Not on file  . Ran Out of Food in the Last Year: Not on file  Transportation Needs:   . Lack of Transportation (Medical): Not on file  . Lack of Transportation (Non-Medical): Not on file  Physical Activity:   . Days of Exercise per Week: Not on file  . Minutes of Exercise per Session: Not on file  Stress:   . Feeling of Stress : Not on file  Social Connections:   . Frequency of Communication with Friends and Family: Not on file  . Frequency of Social Gatherings with Friends and Family: Not on file  . Attends  Religious Services: Not on file  . Active Member of Clubs or Organizations: Not on file  . Attends Archivist Meetings: Not on file  . Marital Status: Not on file  Intimate Partner Violence:   . Fear of Current or Ex-Partner: Not on file  . Emotionally Abused: Not on file  . Physically Abused: Not on file  . Sexually Abused: Not on file    Past Medical History, Surgical history, Social history, and Family history were reviewed and updated as appropriate.   Please see review of systems for further details on the patient's review from today.   Objective:   Physical Exam:  LMP 08/09/2011   Physical Exam Constitutional:      General: She is not in acute distress.    Appearance: She is well-developed.  Musculoskeletal:        General: No deformity.  Neurological:     Mental Status: She is alert and oriented to person, place, and time.     Coordination: Coordination normal.  Psychiatric:        Attention and Perception: Attention and perception normal. She does not perceive auditory or visual hallucinations.        Mood and Affect: Mood normal. Mood is not anxious or depressed. Affect is not labile, blunt, angry or inappropriate.        Speech: Speech normal.        Behavior: Behavior normal.        Thought Content: Thought content normal. Thought content is not paranoid or delusional. Thought content does not include homicidal or suicidal ideation. Thought content does not include homicidal or suicidal plan.        Cognition and Memory: Cognition and memory normal.        Judgment: Judgment normal.     Comments: Insight intact     Lab Review:     Component Value Date/Time   NA 139 06/30/2011 1440   K 4.4 06/30/2011 1440   CL 101 06/30/2011 1440   CO2 29 06/30/2011 1440   GLUCOSE 79 06/30/2011 1440   BUN 12 06/30/2011 1440   CREATININE 0.84 06/30/2011 1440   CALCIUM 10.4 06/30/2011 1440   PROT 7.6 06/30/2011 1440   ALBUMIN 3.8 06/30/2011 1440   AST 21  06/30/2011 1440   ALT 19 06/30/2011 1440   ALKPHOS 53 06/30/2011 1440   BILITOT 0.4 06/30/2011 1440   GFRNONAA >60 06/30/2011 1440   GFRAA >60 06/30/2011 1440       Component Value Date/Time   WBC 11.7 (H) 09/06/2011 0545   RBC 3.47 (L) 09/06/2011 0545   HGB  11.0 (L) 09/06/2011 0545   HGB 13.0 05/15/2011 0854   HCT 32.4 (L) 09/06/2011 0545   HCT 37.3 05/15/2011 0854   PLT 256 09/06/2011 0545   PLT 257 05/15/2011 0854   MCV 93.4 09/06/2011 0545   MCV 86 05/15/2011 0854   MCH 31.7 09/06/2011 0545   MCHC 34.0 09/06/2011 0545   RDW 11.8 09/06/2011 0545   RDW 15.1 05/15/2011 0854   LYMPHSABS 1.0 06/30/2011 1440   LYMPHSABS 1.1 05/15/2011 0854   MONOABS 0.7 06/30/2011 1440   EOSABS 0.1 06/30/2011 1440   EOSABS 0.1 05/15/2011 0854   BASOSABS 0.0 06/30/2011 1440   BASOSABS 0.0 05/15/2011 0854    No results found for: POCLITH, LITHIUM   No results found for: PHENYTOIN, PHENOBARB, VALPROATE, CBMZ   .res Assessment: Plan:    Plan:  1. Continue Trilafon 8mg  at hs 2. Wellbutrin XL 300mg  every morning 3. Zoloft 75mg  at hs 4. Trazadone 150mg  at hs  Supplements:  MVI Magnesium Vit E Coq10 Fish oil Melatonin B12  Sees Rosary Lively for therapy  RTC 3 months  Patient advised to contact office with any questions, adverse effects, or acute worsening in signs and symptoms.  Discussed potential metabolic side effects associated with atypical antipsychotics, as well as potential risk for movement side effects. Advised pt to contact office if movement side effects occur.    Chloe Dennis was seen today for insomnia, depression, anxiety, panic attack and other.  Diagnoses and all orders for this visit:  Bipolar affective disorder in remission ([place]) -     perphenazine (TRILAFON) 8 MG tablet; Take 1 tablet (8 mg total) by mouth daily.  Major depressive disorder, recurrent episode, moderate (HCC) -     buPROPion (WELLBUTRIN XL) 300 MG 24 hr tablet; Take 1 tablet (300 mg total) by  mouth daily. -     sertraline (ZOLOFT) 100 MG tablet; Take 1.5 tablets (150 mg total) by mouth daily.  Generalized anxiety disorder -     buPROPion (WELLBUTRIN XL) 300 MG 24 hr tablet; Take 1 tablet (300 mg total) by mouth daily. -     sertraline (ZOLOFT) 100 MG tablet; Take 1.5 tablets (150 mg total) by mouth daily.  Insomnia, unspecified type -     traZODone (DESYREL) 50 MG tablet; Take 3 tablets (150 mg total) by mouth at bedtime.  Panic attacks -     sertraline (ZOLOFT) 100 MG tablet; Take 1.5 tablets (150 mg total) by mouth daily.     Please see After Visit Summary for patient specific instructions.  Future Appointments  Date Time Provider [place]  04/18/2020 10:00 AM Kristen Fromm, Berdie Ogren, NP CP-CP None    No orders of the defined types were placed in this encounter.   -------------------------------

## 2019-11-06 ENCOUNTER — Other Ambulatory Visit: Payer: Self-pay

## 2019-11-06 DIAGNOSIS — F331 Major depressive disorder, recurrent, moderate: Secondary | ICD-10-CM

## 2019-11-06 DIAGNOSIS — F411 Generalized anxiety disorder: Secondary | ICD-10-CM

## 2019-11-06 MED ORDER — BUPROPION HCL ER (XL) 300 MG PO TB24: 300.0000 mg | ORAL_TABLET | Freq: Every day | ORAL | 0 refills | Status: DC

## 2020-01-18 ENCOUNTER — Encounter: Payer: Self-pay | Admitting: Adult Health

## 2020-01-18 ENCOUNTER — Ambulatory Visit (INDEPENDENT_AMBULATORY_CARE_PROVIDER_SITE_OTHER): Payer: 59 | Admitting: Adult Health

## 2020-01-18 ENCOUNTER — Other Ambulatory Visit: Payer: Self-pay

## 2020-01-18 DIAGNOSIS — G4700 Insomnia, unspecified: Secondary | ICD-10-CM

## 2020-01-18 DIAGNOSIS — F3170 Bipolar disorder, currently in remission, most recent episode unspecified: Secondary | ICD-10-CM

## 2020-01-18 DIAGNOSIS — F410 Panic disorder [episodic paroxysmal anxiety] without agoraphobia: Secondary | ICD-10-CM

## 2020-01-18 DIAGNOSIS — F331 Major depressive disorder, recurrent, moderate: Secondary | ICD-10-CM

## 2020-01-18 DIAGNOSIS — F411 Generalized anxiety disorder: Secondary | ICD-10-CM

## 2020-01-18 MED ORDER — SERTRALINE HCL 100 MG PO TABS: 150.0000 mg | ORAL_TABLET | Freq: Every day | ORAL | 5 refills | Status: DC

## 2020-01-18 MED ORDER — TRAZODONE HCL 50 MG PO TABS: 150.0000 mg | ORAL_TABLET | Freq: Every day | ORAL | 5 refills | Status: DC

## 2020-01-18 MED ORDER — PERPHENAZINE 8 MG PO TABS: 8.0000 mg | ORAL_TABLET | Freq: Every day | ORAL | 5 refills | Status: DC

## 2020-01-18 MED ORDER — BUPROPION HCL ER (XL) 300 MG PO TB24: 300.0000 mg | ORAL_TABLET | Freq: Every day | ORAL | 5 refills | Status: DC

## 2020-01-18 NOTE — Progress Notes (Signed)
KIMORE BLASH [id number] 1960-11-23 59 y.o.  Subjective:   Patient ID:  Chloe Dennis is a 59 y.o. (DOB [birth year]) female.  Chief Complaint: No chief complaint on file.   HPI   Chloe Dennis presents to the office today for follow-up of BPD, MDD, GAD, and insomnia.  Describes mood today as "ok". Pleasant.  Mood symptoms - denies depression, anxiety, and irritability. Stating "I feel like I'm doing ok - pretty stable". Husband doing well. Planning a trip to [place] over the summer. Seeing therapist regularly. Stable interest and motivation. Taking medications as prescribed.  Energy levels stable. Active, does not have a regular exercise routine.  Enjoys some usual interests and activities. Married. Lives with husband of 14 years. Has a son from first marriage - 21 - lives in [place]. Has 5 sisters and 1 brother. Appetite adequate. Weight loss - 6 pounds Sleeps well most nights. Averages 6 to 8 hours. Focus and concentration stable. Completing tasks. Managing aspects of household. Works part-time at [employer]. Denies [place] or [place]. Denies AH or VH.   Review of Systems:  Review of Systems  Musculoskeletal: Negative for gait problem.  Neurological: Negative for tremors.  Psychiatric/Behavioral:       Please refer to HPI    Medications: I have reviewed the patient's current medications.  Current Outpatient Medications  Medication Sig Dispense Refill  . buPROPion (WELLBUTRIN XL) 300 MG 24 hr tablet Take 1 tablet (300 mg total) by mouth daily. 30 tablet 5  . lisinopril (ZESTRIL) 20 MG tablet Take 20 mg by mouth daily.    Marland Kitchen perphenazine (TRILAFON) 8 MG tablet Take 1 tablet (8 mg total) by mouth daily. 30 tablet 5  . sertraline (ZOLOFT) 100 MG tablet Take 1.5 tablets (150 mg total) by mouth daily. 45 tablet 5  . simvastatin (ZOCOR) 20 MG tablet Take 20 mg by mouth at bedtime.    . traZODone (DESYREL) 50 MG tablet Take 3 tablets (150 mg total) by mouth at bedtime. 90 tablet 5  .  valACYclovir (VALTREX) 500 MG tablet Take by mouth.     No current facility-administered medications for this visit.    Medication Side Effects: None  Allergies:  Allergies  Allergen Reactions  . Penicillins Hives    whelps    Past Medical History:  Diagnosis Date  . Anemia    03/2011  . Anxiety    no meds  . Blood transfusion    2012 in [facility]  . Depression    no meds, bipolar no meds    Family History  Problem Relation Age of Onset  . Breast cancer Sister 64    Social History   Socioeconomic History  . Marital status: Married    Spouse name: Not on file  . Number of children: Not on file  . Years of education: Not on file  . Highest education level: Not on file  Occupational History  . Not on file  Tobacco Use  . Smoking status: Never Smoker  . Smokeless tobacco: Never Used  Substance and Sexual Activity  . Alcohol use: No  . Drug use: No  . Sexual activity: Yes    Partners: Male    Birth control/protection: None    Comment: married  Other Topics Concern  . Not on file  Social History Narrative  . Not on file   Social Determinants of Health   Financial Resource Strain:   . Difficulty of Paying Living Expenses:   Food Insecurity:   .  Worried About Charity fundraiser in the Last Year:   . Arboriculturist in the Last Year:   Transportation Needs:   . Film/video editor (Medical):   Marland Kitchen Lack of Transportation (Non-Medical):   Physical Activity:   . Days of Exercise per Week:   . Minutes of Exercise per Session:   Stress:   . Feeling of Stress :   Social Connections:   . Frequency of Communication with Friends and Family:   . Frequency of Social Gatherings with Friends and Family:   . Attends Religious Services:   . Active Member of Clubs or Organizations:   . Attends Archivist Meetings:   Marland Kitchen Marital Status:   Intimate Partner Violence:   . Fear of Current or Ex-Partner:   . Emotionally Abused:   Marland Kitchen Physically Abused:   .  Sexually Abused:     Past Medical History, Surgical history, Social history, and Family history were reviewed and updated as appropriate.   Please see review of systems for further details on the patient's review from today.   Objective:   Physical Exam:  LMP 08/09/2011   Physical Exam Constitutional:      General: She is not in acute distress. Musculoskeletal:        General: No deformity.  Neurological:     Mental Status: She is alert and oriented to person, place, and time.     Coordination: Coordination normal.  Psychiatric:        Attention and Perception: Attention and perception normal. She does not perceive auditory or visual hallucinations.        Mood and Affect: Mood normal. Mood is not anxious or depressed. Affect is not labile, blunt, angry or inappropriate.        Speech: Speech normal.        Behavior: Behavior normal.        Thought Content: Thought content normal. Thought content is not paranoid or delusional. Thought content does not include homicidal or suicidal ideation. Thought content does not include homicidal or suicidal plan.        Cognition and Memory: Cognition and memory normal.        Judgment: Judgment normal.     Comments: Insight intact     Lab Review:     Component Value Date/Time   NA 139 06/30/2011 1440   K 4.4 06/30/2011 1440   CL 101 06/30/2011 1440   CO2 29 06/30/2011 1440   GLUCOSE 79 06/30/2011 1440   BUN 12 06/30/2011 1440   CREATININE 0.84 06/30/2011 1440   CALCIUM 10.4 06/30/2011 1440   PROT 7.6 06/30/2011 1440   ALBUMIN 3.8 06/30/2011 1440   AST 21 06/30/2011 1440   ALT 19 06/30/2011 1440   ALKPHOS 53 06/30/2011 1440   BILITOT 0.4 06/30/2011 1440   GFRNONAA >60 06/30/2011 1440   GFRAA >60 06/30/2011 1440       Component Value Date/Time   WBC 11.7 (H) 09/06/2011 0545   RBC 3.47 (L) 09/06/2011 0545   HGB 11.0 (L) 09/06/2011 0545   HGB 13.0 05/15/2011 0854   HCT 32.4 (L) 09/06/2011 0545   HCT 37.3 05/15/2011 0854    PLT 256 09/06/2011 0545   PLT 257 05/15/2011 0854   MCV 93.4 09/06/2011 0545   MCV 86 05/15/2011 0854   MCH 31.7 09/06/2011 0545   MCHC 34.0 09/06/2011 0545   RDW 11.8 09/06/2011 0545   RDW 15.1 05/15/2011 0854   LYMPHSABS 1.0 06/30/2011  1440   LYMPHSABS 1.1 05/15/2011 0854   MONOABS 0.7 06/30/2011 1440   EOSABS 0.1 06/30/2011 1440   EOSABS 0.1 05/15/2011 0854   BASOSABS 0.0 06/30/2011 1440   BASOSABS 0.0 05/15/2011 0854    No results found for: POCLITH, LITHIUM   No results found for: PHENYTOIN, PHENOBARB, VALPROATE, CBMZ   .res Assessment: Plan:   Plan:  1. Continue Trilafon 8mg  at hs 2. Wellbutrin XL 300mg  every morning 3. Zoloft 75mg  at hs 4. Trazadone 150mg  at hs  Supplements:  MVI Magnesium Vit E Coq10 Fish oil Melatonin B12  Sees Rosary Lively for therapy  RTC 3 months  Patient advised to contact office with any questions, adverse effects, or acute worsening in signs and symptoms.  Discussed potential metabolic side effects associated with atypical antipsychotics, as well as potential risk for movement side effects. Advised pt to contact office if movement side effects occur.    Diagnoses and all orders for this visit:  Panic attacks -     sertraline (ZOLOFT) 100 MG tablet; Take 1.5 tablets (150 mg total) by mouth daily.  Insomnia, unspecified type -     traZODone (DESYREL) 50 MG tablet; Take 3 tablets (150 mg total) by mouth at bedtime.  Bipolar affective disorder in remission (HCC) -     perphenazine (TRILAFON) 8 MG tablet; Take 1 tablet (8 mg total) by mouth daily.  Major depressive disorder, recurrent episode, moderate (HCC) -     buPROPion (WELLBUTRIN XL) 300 MG 24 hr tablet; Take 1 tablet (300 mg total) by mouth daily. -     sertraline (ZOLOFT) 100 MG tablet; Take 1.5 tablets (150 mg total) by mouth daily.  Generalized anxiety disorder -     buPROPion (WELLBUTRIN XL) 300 MG 24 hr tablet; Take 1 tablet (300 mg total) by mouth daily. -      sertraline (ZOLOFT) 100 MG tablet; Take 1.5 tablets (150 mg total) by mouth daily.     Please see After Visit Summary for patient specific instructions.  No future appointments.  No orders of the defined types were placed in this encounter.   -------------------------------

## 2020-02-29 ENCOUNTER — Other Ambulatory Visit: Payer: Self-pay | Admitting: Obstetrics and Gynecology

## 2020-02-29 DIAGNOSIS — Z1231 Encounter for screening mammogram for malignant neoplasm of breast: Secondary | ICD-10-CM

## 2020-03-16 ENCOUNTER — Other Ambulatory Visit: Payer: Self-pay

## 2020-03-16 ENCOUNTER — Ambulatory Visit
Admission: RE | Admit: 2020-03-16 | Discharge: 2020-03-16 | Disposition: A | Discharge status: Home / Self Care | Payer: 59 | Source: Ambulatory Visit | Attending: Obstetrics and Gynecology | Admitting: Obstetrics and Gynecology

## 2020-03-16 DIAGNOSIS — Z1231 Encounter for screening mammogram for malignant neoplasm of breast: Secondary | ICD-10-CM

## 2020-03-17 ENCOUNTER — Other Ambulatory Visit: Payer: Self-pay | Admitting: Obstetrics and Gynecology

## 2020-03-17 DIAGNOSIS — R928 Other abnormal and inconclusive findings on diagnostic imaging of breast: Secondary | ICD-10-CM

## 2020-04-04 ENCOUNTER — Other Ambulatory Visit: Payer: Self-pay

## 2020-04-04 ENCOUNTER — Ambulatory Visit
Admission: RE | Admit: 2020-04-04 | Discharge: 2020-04-04 | Disposition: A | Discharge status: Home / Self Care | Payer: 59 | Source: Ambulatory Visit | Attending: Obstetrics and Gynecology | Admitting: Obstetrics and Gynecology

## 2020-04-04 DIAGNOSIS — R928 Other abnormal and inconclusive findings on diagnostic imaging of breast: Secondary | ICD-10-CM

## 2020-04-18 ENCOUNTER — Ambulatory Visit: Payer: 59 | Admitting: Adult Health

## 2020-04-19 ENCOUNTER — Encounter: Payer: Self-pay | Admitting: Adult Health

## 2020-04-19 ENCOUNTER — Other Ambulatory Visit: Payer: Self-pay

## 2020-04-19 ENCOUNTER — Ambulatory Visit (INDEPENDENT_AMBULATORY_CARE_PROVIDER_SITE_OTHER): Payer: 59 | Admitting: Adult Health

## 2020-04-19 DIAGNOSIS — G4700 Insomnia, unspecified: Secondary | ICD-10-CM | POA: Diagnosis not present

## 2020-04-19 DIAGNOSIS — F331 Major depressive disorder, recurrent, moderate: Secondary | ICD-10-CM

## 2020-04-19 DIAGNOSIS — F411 Generalized anxiety disorder: Secondary | ICD-10-CM

## 2020-04-19 DIAGNOSIS — F3170 Bipolar disorder, currently in remission, most recent episode unspecified: Secondary | ICD-10-CM

## 2020-04-19 NOTE — Progress Notes (Signed)
Chloe Dennis [id number] Nov 05, 1960 59 y.o.  Subjective:   Patient ID:  Chloe Dennis is a 59 y.o. (DOB 26-May-1961) female.  Chief Complaint: No chief complaint on file.   HPI Chloe Dennis presents to the office today for follow-up of BPD, MDD, GAD, and insomnia.  Describes mood today as "ok". Pleasant. Mood symptoms - denies depression, anxiety, and irritability. Stating "I feel like I'm doing pretty good". Recent trip to [place] with husband and family. She and husband doing well. Seeing therapist regularly. Stable interest and motivation. Taking medications as prescribed.  Energy levels "better some days than others". Active, does not have a regular exercise routine.  Enjoys some usual interests and activities. Married. Lives with husband of 14 years. Has a son from first marriage - 43 - lives in [place] - now engaged - getting married in [place]. Has 5 sisters and 1 brother. Appetite adequate. Weight loss - 140 pounds. Sleeps well most nights. Averages 6 to 8 hours. Focus and concentration stable. Completing tasks. Managing aspects of household. Works part-time at [employer] - 5 days a week. Denies SI or HI. Denies AH or VH.   Review of Systems:  Review of Systems  Musculoskeletal: Negative for gait problem.  Neurological: Negative for tremors.  Psychiatric/Behavioral:       Please refer to HPI    Medications: I have reviewed the patient's current medications.  Current Outpatient Medications  Medication Sig Dispense Refill  . buPROPion (WELLBUTRIN XL) 300 MG 24 hr tablet Take 1 tablet (300 mg total) by mouth daily. 30 tablet 5  . lisinopril (ZESTRIL) 20 MG tablet Take 20 mg by mouth daily.    Marland Kitchen perphenazine (TRILAFON) 8 MG tablet Take 1 tablet (8 mg total) by mouth daily. 30 tablet 5  . sertraline (ZOLOFT) 100 MG tablet Take 1.5 tablets (150 mg total) by mouth daily. 45 tablet 5  . simvastatin (ZOCOR) 20 MG tablet Take 20 mg by mouth at bedtime.    .  traZODone (DESYREL) 50 MG tablet Take 3 tablets (150 mg total) by mouth at bedtime. 90 tablet 5  . valACYclovir (VALTREX) 1000 MG tablet Take 1,000 mg by mouth daily.    . valACYclovir (VALTREX) 500 MG tablet Take by mouth.     No current facility-administered medications for this visit.    Medication Side Effects: None  Allergies:  Allergies  Allergen Reactions  . Penicillins Hives    whelps    Past Medical History:  Diagnosis Date  . Anemia    03/2011  . Anxiety    no meds  . Blood transfusion    2012 in [facility]  . Depression    no meds, bipolar no meds    Family History  Problem Relation Age of Onset  . Breast cancer Sister 13    Social History   Socioeconomic History  . Marital status: Married    Spouse name: Not on file  . Number of children: Not on file  . Years of education: Not on file  . Highest education level: Not on file  Occupational History  . Not on file  Tobacco Use  . Smoking status: Never Smoker  . Smokeless tobacco: Never Used  Substance and Sexual Activity  . Alcohol use: No  . Drug use: No  . Sexual activity: Yes    Partners: Male    Birth control/protection: None    Comment: married  Other Topics Concern  . Not on file  Social History  Narrative  . Not on file   Social Determinants of Health   Financial Resource Strain:   . Difficulty of Paying Living Expenses:   Food Insecurity:   . Worried About Charity fundraiser in the Last Year:   . Arboriculturist in the Last Year:   Transportation Needs:   . Film/video editor (Medical):   Marland Kitchen Lack of Transportation (Non-Medical):   Physical Activity:   . Days of Exercise per Week:   . Minutes of Exercise per Session:   Stress:   . Feeling of Stress :   Social Connections:   . Frequency of Communication with Friends and Family:   . Frequency of Social Gatherings with Friends and Family:   . Attends Religious Services:   . Active Member of Clubs or Organizations:   . Attends  Archivist Meetings:   Marland Kitchen Marital Status:   Intimate Partner Violence:   . Fear of Current or Ex-Partner:   . Emotionally Abused:   Marland Kitchen Physically Abused:   . Sexually Abused:     Past Medical History, Surgical history, Social history, and Family history were reviewed and updated as appropriate.   Please see review of systems for further details on the patient's review from today.   Objective:   Physical Exam:  LMP 08/09/2011   Physical Exam Constitutional:      General: She is not in acute distress. Musculoskeletal:        General: No deformity.  Neurological:     Mental Status: She is alert and oriented to person, place, and time.     Coordination: Coordination normal.  Psychiatric:        Attention and Perception: Attention and perception normal. She does not perceive auditory or visual hallucinations.        Mood and Affect: Mood normal. Mood is not anxious or depressed. Affect is not labile, blunt, angry or inappropriate.        Speech: Speech normal.        Behavior: Behavior normal.        Thought Content: Thought content normal. Thought content is not paranoid or delusional. Thought content does not include homicidal or suicidal ideation. Thought content does not include homicidal or suicidal plan.        Cognition and Memory: Cognition and memory normal.        Judgment: Judgment normal.     Comments: Insight intact     Lab Review:     Component Value Date/Time   NA 139 06/30/2011 1440   K 4.4 06/30/2011 1440   CL 101 06/30/2011 1440   CO2 29 06/30/2011 1440   GLUCOSE 79 06/30/2011 1440   BUN 12 06/30/2011 1440   CREATININE 0.84 06/30/2011 1440   CALCIUM 10.4 06/30/2011 1440   PROT 7.6 06/30/2011 1440   ALBUMIN 3.8 06/30/2011 1440   AST 21 06/30/2011 1440   ALT 19 06/30/2011 1440   ALKPHOS 53 06/30/2011 1440   BILITOT 0.4 06/30/2011 1440   GFRNONAA >60 06/30/2011 1440   GFRAA >60 06/30/2011 1440       Component Value Date/Time   WBC 11.7  (H) 09/06/2011 0545   RBC 3.47 (L) 09/06/2011 0545   HGB 11.0 (L) 09/06/2011 0545   HGB 13.0 05/15/2011 0854   HCT 32.4 (L) 09/06/2011 0545   HCT 37.3 05/15/2011 0854   PLT 256 09/06/2011 0545   PLT 257 05/15/2011 0854   MCV 93.4 09/06/2011 0545   MCV  86 05/15/2011 0854   MCH 31.7 09/06/2011 0545   MCHC 34.0 09/06/2011 0545   RDW 11.8 09/06/2011 0545   RDW 15.1 05/15/2011 0854   LYMPHSABS 1.0 06/30/2011 1440   LYMPHSABS 1.1 05/15/2011 0854   MONOABS 0.7 06/30/2011 1440   EOSABS 0.1 06/30/2011 1440   EOSABS 0.1 05/15/2011 0854   BASOSABS 0.0 06/30/2011 1440   BASOSABS 0.0 05/15/2011 0854    No results found for: POCLITH, LITHIUM   No results found for: PHENYTOIN, PHENOBARB, VALPROATE, CBMZ   .res Assessment: Plan:    Plan:  1. Continue Trilafon 8mg  at hs 2. Wellbutrin XL 300mg  every morning 3. Zoloft 50mg  at hs - decreased to 50mg  after feeling "too good".  4. Trazadone 150mg  at hs  Supplements:  MVI Magnesium Vit E Coq10 Fish oil Melatonin B12  Sees Rosary Lively for therapy  RTC 3 months  Patient advised to contact office with any questions, adverse effects, or acute worsening in signs and symptoms.  Discussed potential metabolic side effects associated with atypical antipsychotics, as well as potential risk for movement side effects. Advised pt to contact office if movement side effects occur.    Diagnoses and all orders for this visit:  Insomnia, unspecified type  Bipolar affective disorder in remission ([place])  Major depressive disorder, recurrent episode, moderate ([place])  Generalized anxiety disorder     Please see After Visit Summary for patient specific instructions.  No future appointments.  No orders of the defined types were placed in this encounter.   -------------------------------

## 2020-07-20 ENCOUNTER — Ambulatory Visit (INDEPENDENT_AMBULATORY_CARE_PROVIDER_SITE_OTHER): Payer: 59 | Admitting: Adult Health

## 2020-07-20 ENCOUNTER — Other Ambulatory Visit: Payer: Self-pay

## 2020-07-20 ENCOUNTER — Encounter: Payer: Self-pay | Admitting: Adult Health

## 2020-07-20 DIAGNOSIS — F410 Panic disorder [episodic paroxysmal anxiety] without agoraphobia: Secondary | ICD-10-CM

## 2020-07-20 DIAGNOSIS — F3170 Bipolar disorder, currently in remission, most recent episode unspecified: Secondary | ICD-10-CM

## 2020-07-20 DIAGNOSIS — F411 Generalized anxiety disorder: Secondary | ICD-10-CM

## 2020-07-20 DIAGNOSIS — G4700 Insomnia, unspecified: Secondary | ICD-10-CM

## 2020-07-20 DIAGNOSIS — F331 Major depressive disorder, recurrent, moderate: Secondary | ICD-10-CM | POA: Diagnosis not present

## 2020-07-20 MED ORDER — PERPHENAZINE 8 MG PO TABS: 8.0000 mg | ORAL_TABLET | Freq: Every day | ORAL | 5 refills | Status: DC

## 2020-07-20 MED ORDER — TRAZODONE HCL 50 MG PO TABS: 150.0000 mg | ORAL_TABLET | Freq: Every day | ORAL | 5 refills | Status: DC

## 2020-07-20 MED ORDER — SERTRALINE HCL 100 MG PO TABS: 150.0000 mg | ORAL_TABLET | Freq: Every day | ORAL | 5 refills | Status: DC

## 2020-07-20 MED ORDER — BUPROPION HCL ER (XL) 300 MG PO TB24: 300.0000 mg | ORAL_TABLET | Freq: Every day | ORAL | 5 refills | Status: DC

## 2020-07-20 NOTE — Progress Notes (Signed)
Chloe Dennis [id number] 12-20-1960 59 y.o.  Subjective:   Patient ID:  Chloe Dennis is a 59 y.o. (DOB [birth year]) female.  Chief Complaint: No chief complaint on file.   HPI Chloe Dennis presents to the office today for follow-up of BPD, MDD, GAD, and insomnia.  Describes mood today as "ok". Pleasant. Mood symptoms - denies depression, anxiety, and irritability. Stating "I'm doing pretty good". 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 Has not been able to participate with Covid restrictions. She and husband doing well. Son getting married in [place] in November. Seeing therapist regularly. Stable interest and motivation. Taking medications as prescribed.  Energy levels "not so good lately". Active, does not have a regular exercise routine.  Enjoys some usual interests and activities. Married. Lives with husband of 14 years. Has a son from first marriage - 53 - lives in [place]. Has 5 sisters and 1 brother. Appetite adequate. Weight loss - 140 pounds. Sleeps well most nights. Averages 6 to 8 hours. Focus and concentration stable. Completing tasks. Managing aspects of household. Works part-time at [employer] - 5 days a week. Denies SI or HI. Denies AH or VH.   Review of Systems:  Review of Systems  Musculoskeletal: Negative for gait problem.  Neurological: Negative for tremors.  Psychiatric/Behavioral:       Please refer to HPI    Medications: I have reviewed the patient's current medications.  Current Outpatient Medications  Medication Sig Dispense Refill  . buPROPion (WELLBUTRIN XL) 300 MG 24 hr tablet Take 1 tablet (300 mg total) by mouth daily. 30 tablet 5  . lisinopril (ZESTRIL) 20 MG tablet Take 20 mg by mouth daily.    Marland Kitchen perphenazine (TRILAFON) 8 MG tablet Take 1 tablet (8 mg total) by mouth daily. 30 tablet 5  . sertraline (ZOLOFT) 100 MG tablet Take 1.5 tablets (150 mg total) by mouth daily. 45 tablet 5  . simvastatin  (ZOCOR) 20 MG tablet Take 20 mg by mouth at bedtime.    . traZODone (DESYREL) 50 MG tablet Take 3 tablets (150 mg total) by mouth at bedtime. 90 tablet 5  . valACYclovir (VALTREX) 1000 MG tablet Take 1,000 mg by mouth daily.    . valACYclovir (VALTREX) 500 MG tablet Take by mouth.     No current facility-administered medications for this visit.    Medication Side Effects: None  Allergies:  Allergies  Allergen Reactions  . Penicillins Hives    whelps    Past Medical History:  Diagnosis Date  . Anemia    03/2011  . Anxiety    no meds  . Blood transfusion    2012 in [facility]  . Depression    no meds, bipolar no meds    Family History  Problem Relation Age of Onset  . Breast cancer Sister 37    Social History   Socioeconomic History  . Marital status: Married    Spouse name: Not on file  . Number of children: Not on file  . Years of education: Not on file  . Highest education level: Not on file  Occupational History  . Not on file  Tobacco Use  . Smoking status: Never Smoker  . Smokeless tobacco: Never Used  Substance and Sexual Activity  . Alcohol use: No  . Drug use: No  . Sexual activity: Yes    Partners: Male    Birth control/protection: None    Comment: married  Other Topics Concern  .  Not on file  Social History Narrative  . Not on file   Social Determinants of Health   Financial Resource Strain:   . Difficulty of Paying Living Expenses: Not on file  Food Insecurity:   . Worried About Charity fundraiser in the Last Year: Not on file  . Ran Out of Food in the Last Year: Not on file  Transportation Needs:   . Lack of Transportation (Medical): Not on file  . Lack of Transportation (Non-Medical): Not on file  Physical Activity:   . Days of Exercise per Week: Not on file  . Minutes of Exercise per Session: Not on file  Stress:   . Feeling of Stress : Not on file  Social Connections:   . Frequency of Communication with Friends and Family: Not  on file  . Frequency of Social Gatherings with Friends and Family: Not on file  . Attends Religious Services: Not on file  . Active Member of Clubs or Organizations: Not on file  . Attends Archivist Meetings: Not on file  . Marital Status: Not on file  Intimate Partner Violence:   . Fear of Current or Ex-Partner: Not on file  . Emotionally Abused: Not on file  . Physically Abused: Not on file  . Sexually Abused: Not on file    Past Medical History, Surgical history, Social history, and Family history were reviewed and updated as appropriate.   Please see review of systems for further details on the patient's review from today.   Objective:   Physical Exam:  LMP 08/09/2011   Physical Exam Constitutional:      General: She is not in acute distress. Musculoskeletal:        General: No deformity.  Neurological:     Mental Status: She is alert and oriented to person, place, and time.     Coordination: Coordination normal.  Psychiatric:        Attention and Perception: Attention and perception normal. She does not perceive auditory or visual hallucinations.        Mood and Affect: Mood normal. Mood is not anxious or depressed. Affect is not labile, blunt, angry or inappropriate.        Speech: Speech normal.        Behavior: Behavior normal.        Thought Content: Thought content normal. Thought content is not paranoid or delusional. Thought content does not include homicidal or suicidal ideation. Thought content does not include homicidal or suicidal plan.        Cognition and Memory: Cognition and memory normal.        Judgment: Judgment normal.     Comments: Insight intact     Lab Review:     Component Value Date/Time   NA 139 06/30/2011 1440   K 4.4 06/30/2011 1440   CL 101 06/30/2011 1440   CO2 29 06/30/2011 1440   GLUCOSE 79 06/30/2011 1440   BUN 12 06/30/2011 1440   CREATININE 0.84 06/30/2011 1440   CALCIUM 10.4 06/30/2011 1440   PROT 7.6 06/30/2011  1440   ALBUMIN 3.8 06/30/2011 1440   AST 21 06/30/2011 1440   ALT 19 06/30/2011 1440   ALKPHOS 53 06/30/2011 1440   BILITOT 0.4 06/30/2011 1440   GFRNONAA >60 06/30/2011 1440   GFRAA >60 06/30/2011 1440       Component Value Date/Time   WBC 11.7 (H) 09/06/2011 0545   RBC 3.47 (L) 09/06/2011 0545   HGB 11.0 (L)  09/06/2011 0545   HGB 13.0 05/15/2011 0854   HCT 32.4 (L) 09/06/2011 0545   HCT 37.3 05/15/2011 0854   PLT 256 09/06/2011 0545   PLT 257 05/15/2011 0854   MCV 93.4 09/06/2011 0545   MCV 86 05/15/2011 0854   MCH 31.7 09/06/2011 0545   MCHC 34.0 09/06/2011 0545   RDW 11.8 09/06/2011 0545   RDW 15.1 05/15/2011 0854   LYMPHSABS 1.0 06/30/2011 1440   LYMPHSABS 1.1 05/15/2011 0854   MONOABS 0.7 06/30/2011 1440   EOSABS 0.1 06/30/2011 1440   EOSABS 0.1 05/15/2011 0854   BASOSABS 0.0 06/30/2011 1440   BASOSABS 0.0 05/15/2011 0854    No results found for: POCLITH, LITHIUM   No results found for: PHENYTOIN, PHENOBARB, VALPROATE, CBMZ   .res Assessment: Plan:    Plan:  1. Continue Trilafon 8mg  at hs 2. Wellbutrin XL 300mg  every morning 3. Zoloft 50mg  at hs - decreased to 50mg  after feeling "too good".  4. Trazadone 150mg  at hs  Supplements:  MVI Magnesium Vit E Coq10 Fish oil Melatonin B12  Sees Rosary Lively for therapy  RTC 3/4 months  Patient advised to contact office with any questions, adverse effects, or acute worsening in signs and symptoms.  Discussed potential metabolic side effects associated with atypical antipsychotics, as well as potential risk for movement side effects. Advised pt to contact office if movement side effects occur.    Diagnoses and all orders for this visit:  Bipolar affective disorder in remission ([facility]) -     perphenazine (TRILAFON) 8 MG tablet; Take 1 tablet (8 mg total) by mouth daily.  Major depressive disorder, recurrent episode, moderate ([facility]) -     buPROPion (WELLBUTRIN XL) 300 MG 24 hr tablet; Take 1 tablet (300  mg total) by mouth daily. -     sertraline (ZOLOFT) 100 MG tablet; Take 1.5 tablets (150 mg total) by mouth daily.  Generalized anxiety disorder -     buPROPion (WELLBUTRIN XL) 300 MG 24 hr tablet; Take 1 tablet (300 mg total) by mouth daily. -     sertraline (ZOLOFT) 100 MG tablet; Take 1.5 tablets (150 mg total) by mouth daily.  Insomnia, unspecified type -     traZODone (DESYREL) 50 MG tablet; Take 3 tablets (150 mg total) by mouth at bedtime.  Panic attacks -     sertraline (ZOLOFT) 100 MG tablet; Take 1.5 tablets (150 mg total) by mouth daily.     Please see After Visit Summary for patient specific instructions.  No future appointments.  No orders of the defined types were placed in this encounter.   -------------------------------

## 2020-08-10 ENCOUNTER — Other Ambulatory Visit: Payer: Self-pay | Admitting: Endocrinology

## 2020-08-10 DIAGNOSIS — M899 Disorder of bone, unspecified: Secondary | ICD-10-CM

## 2020-09-01 ENCOUNTER — Other Ambulatory Visit: Payer: Self-pay | Admitting: Endocrinology

## 2020-09-01 DIAGNOSIS — M8580 Other specified disorders of bone density and structure, unspecified site: Secondary | ICD-10-CM

## 2020-09-13 ENCOUNTER — Other Ambulatory Visit: Payer: Self-pay | Admitting: Endocrinology

## 2020-09-13 ENCOUNTER — Encounter: Payer: Self-pay | Admitting: Psychiatry

## 2020-09-13 DIAGNOSIS — E049 Nontoxic goiter, unspecified: Secondary | ICD-10-CM

## 2020-09-29 ENCOUNTER — Other Ambulatory Visit: Payer: 59

## 2020-10-19 ENCOUNTER — Ambulatory Visit (INDEPENDENT_AMBULATORY_CARE_PROVIDER_SITE_OTHER): Payer: 59 | Admitting: Adult Health

## 2020-10-19 ENCOUNTER — Encounter: Payer: Self-pay | Admitting: Adult Health

## 2020-10-19 ENCOUNTER — Other Ambulatory Visit: Payer: Self-pay

## 2020-10-19 DIAGNOSIS — E049 Nontoxic goiter, unspecified: Secondary | ICD-10-CM | POA: Insufficient documentation

## 2020-10-19 DIAGNOSIS — G4700 Insomnia, unspecified: Secondary | ICD-10-CM

## 2020-10-19 DIAGNOSIS — F331 Major depressive disorder, recurrent, moderate: Secondary | ICD-10-CM

## 2020-10-19 DIAGNOSIS — F3170 Bipolar disorder, currently in remission, most recent episode unspecified: Secondary | ICD-10-CM

## 2020-10-19 DIAGNOSIS — F411 Generalized anxiety disorder: Secondary | ICD-10-CM

## 2020-10-19 DIAGNOSIS — F410 Panic disorder [episodic paroxysmal anxiety] without agoraphobia: Secondary | ICD-10-CM

## 2020-10-19 DIAGNOSIS — E210 Primary hyperparathyroidism: Secondary | ICD-10-CM | POA: Insufficient documentation

## 2020-10-19 DIAGNOSIS — I10 Essential (primary) hypertension: Secondary | ICD-10-CM | POA: Insufficient documentation

## 2020-10-19 DIAGNOSIS — M8580 Other specified disorders of bone density and structure, unspecified site: Secondary | ICD-10-CM | POA: Insufficient documentation

## 2020-10-19 MED ORDER — BUPROPION HCL ER (XL) 300 MG PO TB24: 300.0000 mg | ORAL_TABLET | Freq: Every day | ORAL | 5 refills | Status: DC

## 2020-10-19 MED ORDER — SERTRALINE HCL 100 MG PO TABS: 150.0000 mg | ORAL_TABLET | Freq: Every day | ORAL | 5 refills | Status: DC

## 2020-10-19 MED ORDER — PERPHENAZINE 8 MG PO TABS: 8.0000 mg | ORAL_TABLET | Freq: Every day | ORAL | 5 refills | Status: DC

## 2020-10-19 MED ORDER — TRAZODONE HCL 50 MG PO TABS: 150.0000 mg | ORAL_TABLET | Freq: Every day | ORAL | 5 refills | Status: DC

## 2020-10-19 NOTE — Progress Notes (Signed)
Chloe Dennis [id number] 1961-10-14 59 y.o.  Subjective:   Patient ID:  Chloe Dennis is a 59 y.o. (DOB 02-17-1961) female.  Chief Complaint: No chief complaint on file.   HPI Chloe Dennis presents to the office today for follow-up of BPD, MDD, GAD, and insomnia.  Describes mood today as "ok". Pleasant. Mood symptoms - denies depression, anxiety, and irritability. Stating "I'm doing good" - "feels even". 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 She and husband doing well. Son married in [place] in November. Seeing therapist regularly - Rosary Lively. Stable interest and motivation. Taking medications as prescribed and feels like they continue to work well.  Energy levels "depends on the day". Active, does not have a regular exercise routine.  Enjoys some usual interests and activities. Married. Lives with husband of 14 years. Has a son from first marriage - 74 - lives in [place]. Has 5 sisters and 1 brother. Appetite adequate. Weight gain - 5 pounds  - 145 pounds. Sleeps well most nights. Averages 6 to 8 hours. Focus and concentration stable. Completing tasks. Managing aspects of household. Works part-time at [employer] - 5 days a week. Denies SI or HI.  Denies AH or VH.  Review of Systems:  Review of Systems  Musculoskeletal: Negative for gait problem.  Neurological: Negative for tremors.  Psychiatric/Behavioral:       Please refer to HPI    Medications: I have reviewed the patient's current medications.  Current Outpatient Medications  Medication Sig Dispense Refill  . buPROPion (WELLBUTRIN XL) 300 MG 24 hr tablet Take 1 tablet (300 mg total) by mouth daily. 30 tablet 5  . lisinopril (ZESTRIL) 20 MG tablet Take 20 mg by mouth daily.    Marland Kitchen perphenazine (TRILAFON) 8 MG tablet Take 1 tablet (8 mg total) by mouth daily. 30 tablet 5  . sertraline (ZOLOFT) 100 MG tablet Take 1.5 tablets (150 mg total) by mouth daily. 45 tablet 5   . simvastatin (ZOCOR) 20 MG tablet Take 20 mg by mouth at bedtime.    . traZODone (DESYREL) 50 MG tablet Take 3 tablets (150 mg total) by mouth at bedtime. 90 tablet 5  . valACYclovir (VALTREX) 1000 MG tablet Take 1,000 mg by mouth daily.    . valACYclovir (VALTREX) 500 MG tablet Take by mouth.     No current facility-administered medications for this visit.    Medication Side Effects: None  Allergies:  Allergies  Allergen Reactions  . Penicillin G     Other reaction(s): Unknown  . Penicillins Hives    whelps    Past Medical History:  Diagnosis Date  . Anemia    03/2011  . Anxiety    no meds  . Blood transfusion    2012 in [facility]  . Depression    no meds, bipolar no meds    Family History  Problem Relation Age of Onset  . Breast cancer Sister 46    Social History   Socioeconomic History  . Marital status: Married    Spouse name: Not on file  . Number of children: Not on file  . Years of education: Not on file  . Highest education level: Not on file  Occupational History  . Not on file  Tobacco Use  . Smoking status: Never Smoker  . Smokeless tobacco: Never Used  Substance and Sexual Activity  . Alcohol use: No  . Drug use: No  . Sexual activity: Yes  Partners: Male    Birth control/protection: None    Comment: married  Other Topics Concern  . Not on file  Social History Narrative  . Not on file   Social Determinants of Health   Financial Resource Strain: Not on file  Food Insecurity: Not on file  Transportation Needs: Not on file  Physical Activity: Not on file  Stress: Not on file  Social Connections: Not on file  Intimate Partner Violence: Not on file    Past Medical History, Surgical history, Social history, and Family history were reviewed and updated as appropriate.   Please see review of systems for further details on the patient's review from today.   Objective:   Physical Exam:  LMP 08/09/2011   Physical  Exam Constitutional:      General: She is not in acute distress. Musculoskeletal:        General: No deformity.  Neurological:     Mental Status: She is alert and oriented to person, place, and time.     Coordination: Coordination normal.  Psychiatric:        Attention and Perception: Attention and perception normal. She does not perceive auditory or visual hallucinations.        Mood and Affect: Mood normal. Mood is not anxious or depressed. Affect is not labile, blunt, angry or inappropriate.        Speech: Speech normal.        Behavior: Behavior normal.        Thought Content: Thought content normal. Thought content is not paranoid or delusional. Thought content does not include homicidal or suicidal ideation. Thought content does not include homicidal or suicidal plan.        Cognition and Memory: Cognition and memory normal.        Judgment: Judgment normal.     Comments: Insight intact     Lab Review:     Component Value Date/Time   NA 139 06/30/2011 1440   K 4.4 06/30/2011 1440   CL 101 06/30/2011 1440   CO2 29 06/30/2011 1440   GLUCOSE 79 06/30/2011 1440   BUN 12 06/30/2011 1440   CREATININE 0.84 06/30/2011 1440   CALCIUM 10.4 06/30/2011 1440   PROT 7.6 06/30/2011 1440   ALBUMIN 3.8 06/30/2011 1440   AST 21 06/30/2011 1440   ALT 19 06/30/2011 1440   ALKPHOS 53 06/30/2011 1440   BILITOT 0.4 06/30/2011 1440   GFRNONAA >60 06/30/2011 1440   GFRAA >60 06/30/2011 1440       Component Value Date/Time   WBC 11.7 (H) 09/06/2011 0545   RBC 3.47 (L) 09/06/2011 0545   HGB 11.0 (L) 09/06/2011 0545   HGB 13.0 05/15/2011 0854   HCT 32.4 (L) 09/06/2011 0545   HCT 37.3 05/15/2011 0854   PLT 256 09/06/2011 0545   PLT 257 05/15/2011 0854   MCV 93.4 09/06/2011 0545   MCV 86 05/15/2011 0854   MCH 31.7 09/06/2011 0545   MCHC 34.0 09/06/2011 0545   RDW 11.8 09/06/2011 0545   RDW 15.1 05/15/2011 0854   LYMPHSABS 1.0 06/30/2011 1440   LYMPHSABS 1.1 05/15/2011 0854    MONOABS 0.7 06/30/2011 1440   EOSABS 0.1 06/30/2011 1440   EOSABS 0.1 05/15/2011 0854   BASOSABS 0.0 06/30/2011 1440   BASOSABS 0.0 05/15/2011 0854    No results found for: POCLITH, LITHIUM   No results found for: PHENYTOIN, PHENOBARB, VALPROATE, CBMZ   .res Assessment: Plan:    Plan:  1. Continue Trilafon 8mg   at hs 2. Wellbutrin XL 300mg  every morning 3. Zoloft 50mg  at hs - decreased to 50mg  after feeling "too good".  4. Trazadone 150mg  at hs  Supplements:  MVI Magnesium Vit E Coq10 Fish oil Melatonin B12  Sees Rosary Lively for therapy  RTC 3 months  Patient advised to contact office with any questions, adverse effects, or acute worsening in signs and symptoms.  Discussed potential metabolic side effects associated with atypical antipsychotics, as well as potential risk for movement side effects. Advised pt to contact office if movement side effects occur.     Diagnoses and all orders for this visit:  Bipolar affective disorder in remission ([place]) -     perphenazine (TRILAFON) 8 MG tablet; Take 1 tablet (8 mg total) by mouth daily.  Major depressive disorder, recurrent episode, moderate ([facility]) -     buPROPion (WELLBUTRIN XL) 300 MG 24 hr tablet; Take 1 tablet (300 mg total) by mouth daily. -     sertraline (ZOLOFT) 100 MG tablet; Take 1.5 tablets (150 mg total) by mouth daily.  Generalized anxiety disorder -     buPROPion (WELLBUTRIN XL) 300 MG 24 hr tablet; Take 1 tablet (300 mg total) by mouth daily. -     sertraline (ZOLOFT) 100 MG tablet; Take 1.5 tablets (150 mg total) by mouth daily.  Insomnia, unspecified type -     traZODone (DESYREL) 50 MG tablet; Take 3 tablets (150 mg total) by mouth at bedtime.  Panic attacks -     sertraline (ZOLOFT) 100 MG tablet; Take 1.5 tablets (150 mg total) by mouth daily.     Please see After Visit Summary for patient specific instructions.  Future Appointments  Date Time Provider [place]  10/31/2020  1:45  PM [facility] [place] 1 [facility]  12/26/2020 11:00 AM [facility] 1 [facility]    No orders of the defined types were placed in this encounter.   -------------------------------

## 2020-10-31 ENCOUNTER — Other Ambulatory Visit: Payer: 59

## 2020-11-09 ENCOUNTER — Ambulatory Visit
Admission: RE | Admit: 2020-11-09 | Discharge: 2020-11-09 | Disposition: A | Discharge status: Home / Self Care | Payer: 59 | Source: Ambulatory Visit | Attending: Endocrinology | Admitting: Endocrinology

## 2020-11-09 DIAGNOSIS — E049 Nontoxic goiter, unspecified: Secondary | ICD-10-CM

## 2020-12-26 ENCOUNTER — Other Ambulatory Visit: Payer: 59

## 2021-01-17 ENCOUNTER — Ambulatory Visit (INDEPENDENT_AMBULATORY_CARE_PROVIDER_SITE_OTHER): Payer: 59 | Admitting: Adult Health

## 2021-01-17 ENCOUNTER — Other Ambulatory Visit: Payer: Self-pay

## 2021-01-17 ENCOUNTER — Encounter: Payer: Self-pay | Admitting: Adult Health

## 2021-01-17 DIAGNOSIS — F411 Generalized anxiety disorder: Secondary | ICD-10-CM | POA: Diagnosis not present

## 2021-01-17 DIAGNOSIS — F3170 Bipolar disorder, currently in remission, most recent episode unspecified: Secondary | ICD-10-CM | POA: Diagnosis not present

## 2021-01-17 DIAGNOSIS — F331 Major depressive disorder, recurrent, moderate: Secondary | ICD-10-CM | POA: Diagnosis not present

## 2021-01-17 DIAGNOSIS — G4700 Insomnia, unspecified: Secondary | ICD-10-CM

## 2021-01-17 NOTE — Progress Notes (Signed)
Chloe Dennis [id number] [birth year] 60 y.o.  Subjective:   Patient ID:  Chloe Dennis is a 60 y.o. (DOB [birth year]) female.  Chief Complaint: No chief complaint on file.   HPI Chloe Dennis presents to the office today for follow-up of BPD, MDD, GAD, and insomnia.  Describes mood today as "ok". Pleasant. Mood symptoms - denies depression, anxiety, and irritability. Stating "I'm doing well". 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 She and husband doing well. Son in [place]. Seeing therapist regularly - Chloe Dennis. Stable interest and motivation. Taking medications as prescribed and feels like they continue to work well.  Energy levels varies with sleep. Active, does not have a regular exercise routine.  Enjoys some usual interests and activities. Married. Lives with husband of 14 years. Has a son from first marriage - 62 - lives in [place]. Has 5 sisters and 1 brother. Appetite adequate. Weight stable - 145 pounds. Sleeps well most nights. Averages 6 to 8 hours. Focus and concentration stable. Completing tasks. Managing aspects of household. Works part-time at [employer] - 5 days a week. Denies SI or HI.  Denies AH or VH.  Sees PCP in May for yearly check up  Review of Systems:  Review of Systems  Musculoskeletal: Negative for gait problem.  Neurological: Negative for tremors.  Psychiatric/Behavioral:       Please refer to HPI    Medications: I have reviewed the patient's current medications.  Current Outpatient Medications  Medication Sig Dispense Refill  . buPROPion (WELLBUTRIN XL) 300 MG 24 hr tablet Take 1 tablet (300 mg total) by mouth daily. 30 tablet 5  . lisinopril (ZESTRIL) 20 MG tablet Take 20 mg by mouth daily.    Marland Kitchen perphenazine (TRILAFON) 8 MG tablet Take 1 tablet (8 mg total) by mouth daily. 30 tablet 5  . sertraline (ZOLOFT) 100 MG tablet Take 1.5 tablets (150 mg total) by mouth daily. 45 tablet 5  . simvastatin (ZOCOR) 20  MG tablet Take 20 mg by mouth at bedtime.    . traZODone (DESYREL) 50 MG tablet Take 3 tablets (150 mg total) by mouth at bedtime. 90 tablet 5  . valACYclovir (VALTREX) 1000 MG tablet Take 1,000 mg by mouth daily.    . valACYclovir (VALTREX) 500 MG tablet Take by mouth.     No current facility-administered medications for this visit.    Medication Side Effects: None  Allergies:  Allergies  Allergen Reactions  . Penicillin G     Other reaction(s): Unknown  . Penicillins Hives    whelps    Past Medical History:  Diagnosis Date  . Anemia    03/2011  . Anxiety    no meds  . Blood transfusion    2012 in [facility]  . Depression    no meds, bipolar no meds    Family History  Problem Relation Age of Onset  . Breast cancer Sister 4    Social History   Socioeconomic History  . Marital status: Married    Spouse name: Not on file  . Number of children: Not on file  . Years of education: Not on file  . Highest education level: Not on file  Occupational History  . Not on file  Tobacco Use  . Smoking status: Never Smoker  . Smokeless tobacco: Never Used  Substance and Sexual Activity  . Alcohol use: No  . Drug use: No  . Sexual activity: Yes    Partners: Male  Birth control/protection: None    Comment: married  Other Topics Concern  . Not on file  Social History Narrative  . Not on file   Social Determinants of Health   Financial Resource Strain: Not on file  Food Insecurity: Not on file  Transportation Needs: Not on file  Physical Activity: Not on file  Stress: Not on file  Social Connections: Not on file  Intimate Partner Violence: Not on file    Past Medical History, Surgical history, Social history, and Family history were reviewed and updated as appropriate.   Please see review of systems for further details on the patient's review from today.   Objective:   Physical Exam:  LMP 08/09/2011   Physical Exam Constitutional:      General: She is  not in acute distress. Musculoskeletal:        General: No deformity.  Neurological:     Mental Status: She is alert and oriented to person, place, and time.     Coordination: Coordination normal.  Psychiatric:        Attention and Perception: Attention and perception normal. She does not perceive auditory or visual hallucinations.        Mood and Affect: Mood normal. Mood is not anxious or depressed. Affect is not labile, blunt, angry or inappropriate.        Speech: Speech normal.        Behavior: Behavior normal.        Thought Content: Thought content normal. Thought content is not paranoid or delusional. Thought content does not include homicidal or suicidal ideation. Thought content does not include homicidal or suicidal plan.        Cognition and Memory: Cognition and memory normal.        Judgment: Judgment normal.     Comments: Insight intact     Lab Review:     Component Value Date/Time   NA 139 06/30/2011 1440   K 4.4 06/30/2011 1440   CL 101 06/30/2011 1440   CO2 29 06/30/2011 1440   GLUCOSE 79 06/30/2011 1440   BUN 12 06/30/2011 1440   CREATININE 0.84 06/30/2011 1440   CALCIUM 10.4 06/30/2011 1440   PROT 7.6 06/30/2011 1440   ALBUMIN 3.8 06/30/2011 1440   AST 21 06/30/2011 1440   ALT 19 06/30/2011 1440   ALKPHOS 53 06/30/2011 1440   BILITOT 0.4 06/30/2011 1440   GFRNONAA >60 06/30/2011 1440   GFRAA >60 06/30/2011 1440       Component Value Date/Time   WBC 11.7 (H) 09/06/2011 0545   RBC 3.47 (L) 09/06/2011 0545   HGB 11.0 (L) 09/06/2011 0545   HGB 13.0 05/15/2011 0854   HCT 32.4 (L) 09/06/2011 0545   HCT 37.3 05/15/2011 0854   PLT 256 09/06/2011 0545   PLT 257 05/15/2011 0854   MCV 93.4 09/06/2011 0545   MCV 86 05/15/2011 0854   MCH 31.7 09/06/2011 0545   MCHC 34.0 09/06/2011 0545   RDW 11.8 09/06/2011 0545   RDW 15.1 05/15/2011 0854   LYMPHSABS 1.0 06/30/2011 1440   LYMPHSABS 1.1 05/15/2011 0854   MONOABS 0.7 06/30/2011 1440   EOSABS 0.1  06/30/2011 1440   EOSABS 0.1 05/15/2011 0854   BASOSABS 0.0 06/30/2011 1440   BASOSABS 0.0 05/15/2011 0854    No results found for: POCLITH, LITHIUM   No results found for: PHENYTOIN, PHENOBARB, VALPROATE, CBMZ   .res Assessment: Plan:     Plan:  1. Continue Trilafon 8mg  at hs 2. Wellbutrin  XL 300mg  every morning 3. Zoloft 50mg  at hs  4. Trazadone 150mg  at hs  Supplements:  MVI Magnesium Vit E Coq10 Fish oil Melatonin B12  Sees Chloe Dennis for therapy  RTC 3 months  Patient advised to contact office with any questions, adverse effects, or acute worsening in signs and symptoms.  Discussed potential metabolic side effects associated with atypical antipsychotics, as well as potential risk for movement side effects. Advised pt to contact office if movement side effects occur.      Diagnoses and all orders for this visit:  Bipolar affective disorder in remission ([place])  Insomnia, unspecified type  Major depressive disorder, recurrent episode, moderate ([place])  Generalized anxiety disorder     Please see After Visit Summary for patient specific instructions.  Future Appointments  Date Time Provider [place]  02/09/2021 11:00 AM [facility] DX DEXA 1 [facility]    No orders of the defined types were placed in this encounter.   -------------------------------

## 2021-02-09 ENCOUNTER — Other Ambulatory Visit: Payer: 59

## 2021-04-19 ENCOUNTER — Other Ambulatory Visit: Payer: Self-pay

## 2021-04-19 ENCOUNTER — Ambulatory Visit (INDEPENDENT_AMBULATORY_CARE_PROVIDER_SITE_OTHER): Payer: 59 | Admitting: Adult Health

## 2021-04-19 ENCOUNTER — Encounter: Payer: Self-pay | Admitting: Adult Health

## 2021-04-19 DIAGNOSIS — F331 Major depressive disorder, recurrent, moderate: Secondary | ICD-10-CM

## 2021-04-19 DIAGNOSIS — G4700 Insomnia, unspecified: Secondary | ICD-10-CM

## 2021-04-19 DIAGNOSIS — F411 Generalized anxiety disorder: Secondary | ICD-10-CM | POA: Diagnosis not present

## 2021-04-19 DIAGNOSIS — F3170 Bipolar disorder, currently in remission, most recent episode unspecified: Secondary | ICD-10-CM | POA: Diagnosis not present

## 2021-04-19 DIAGNOSIS — F410 Panic disorder [episodic paroxysmal anxiety] without agoraphobia: Secondary | ICD-10-CM

## 2021-04-19 MED ORDER — BUPROPION HCL ER (XL) 300 MG PO TB24: 300.0000 mg | ORAL_TABLET | Freq: Every day | ORAL | 5 refills | Status: DC

## 2021-04-19 MED ORDER — PERPHENAZINE 8 MG PO TABS: 8.0000 mg | ORAL_TABLET | Freq: Every day | ORAL | 5 refills | Status: DC

## 2021-04-19 MED ORDER — TRAZODONE HCL 50 MG PO TABS: 150.0000 mg | ORAL_TABLET | Freq: Every day | ORAL | 5 refills | Status: DC

## 2021-04-19 MED ORDER — SERTRALINE HCL 100 MG PO TABS: 150.0000 mg | ORAL_TABLET | Freq: Every day | ORAL | 5 refills | Status: DC

## 2021-04-19 NOTE — Progress Notes (Signed)
Ericia Dennis [id number] 1961-06-10 60 y.o.  Subjective:   Patient ID:  Chloe Dennis is a 60 y.o. (DOB 09-09-1961) female.  Chief Complaint: No chief complaint on file.   HPI Chloe Dennis presents to the office today for follow-up of BPD, MDD, GAD, and insomnia.  Describes mood today as "ok". Pleasant. Mood symptoms - denies depression, anxiety, and irritability. Stating "I feel like I'm doing pretty doing". She and husband doing well. Son in [place]. Seeing therapist regularly - Rosary Lively. Stable interest and motivation. Taking medications as prescribed and feels like they continue to work well.  Energy levels vary. Active, does not have a regular exercise routine.  Enjoys some usual interests and activities. Married. Lives with husband of 14 years. Has a son from first marriage - 74 - lives in [place]. Has 5 sisters and 1 brother. Appetite adequate. Weight stable - 145 pounds. Sleeps well most nights. Averages 6 to 8 hours. Focus and concentration stable. Completing tasks. Managing aspects of household. Works part-time at [employer] - 5 days a week. 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  Denies SI or HI.  Denies AH or VH.  Recent check up with PCP    Review of Systems:  Review of Systems  Musculoskeletal:  Negative for gait problem.  Neurological:  Negative for tremors.  Psychiatric/Behavioral:         Please refer to HPI   Medications: I have reviewed the patient's current medications.  Current Outpatient Medications  Medication Sig Dispense Refill   buPROPion (WELLBUTRIN XL) 300 MG 24 hr tablet Take 1 tablet (300 mg total) by mouth daily. 30 tablet 5   lisinopril (ZESTRIL) 20 MG tablet Take 20 mg by mouth daily.     perphenazine (TRILAFON) 8 MG tablet Take 1 tablet (8 mg total) by mouth daily. 30 tablet 5   sertraline (ZOLOFT) 100 MG tablet Take 1.5 tablets (150 mg total) by mouth daily. 45 tablet 5   simvastatin (ZOCOR) 20 MG  tablet Take 20 mg by mouth at bedtime.     traZODone (DESYREL) 50 MG tablet Take 3 tablets (150 mg total) by mouth at bedtime. 90 tablet 5   valACYclovir (VALTREX) 1000 MG tablet Take 1,000 mg by mouth daily.     valACYclovir (VALTREX) 500 MG tablet Take by mouth.     No current facility-administered medications for this visit.    Medication Side Effects: None  Allergies:  Allergies  Allergen Reactions   Other     Other reaction(s): hives   Penicillin G     Other reaction(s): Unknown   Penicillins Hives    whelps Other reaction(s): hives    Past Medical History:  Diagnosis Date   Anemia    03/2011   Anxiety    no meds   Blood transfusion    2012 in [place]   Depression    no meds, bipolar no meds    Past Medical History, Surgical history, Social history, and Family history were reviewed and updated as appropriate.   Please see review of systems for further details on the patient's review from today.   Objective:   Physical Exam:  LMP 08/09/2011   Physical Exam Constitutional:      General: She is not in acute distress. Musculoskeletal:        General: No deformity.  Neurological:     Mental Status: She is alert and oriented to person, place, and time.  Coordination: Coordination normal.  Psychiatric:        Attention and Perception: Attention and perception normal. She does not perceive auditory or visual hallucinations.        Mood and Affect: Mood normal. Mood is not anxious or depressed. Affect is not labile, blunt, angry or inappropriate.        Speech: Speech normal.        Behavior: Behavior normal.        Thought Content: Thought content normal. Thought content is not paranoid or delusional. Thought content does not include homicidal or suicidal ideation. Thought content does not include homicidal or suicidal plan.        Cognition and Memory: Cognition and memory normal.        Judgment: Judgment normal.     Comments: Insight intact    Lab  Review:     Component Value Date/Time   NA 139 06/30/2011 1440   K 4.4 06/30/2011 1440   CL 101 06/30/2011 1440   CO2 29 06/30/2011 1440   GLUCOSE 79 06/30/2011 1440   BUN 12 06/30/2011 1440   CREATININE 0.84 06/30/2011 1440   CALCIUM 10.4 06/30/2011 1440   PROT 7.6 06/30/2011 1440   ALBUMIN 3.8 06/30/2011 1440   AST 21 06/30/2011 1440   ALT 19 06/30/2011 1440   ALKPHOS 53 06/30/2011 1440   BILITOT 0.4 06/30/2011 1440   GFRNONAA >60 06/30/2011 1440   GFRAA >60 06/30/2011 1440       Component Value Date/Time   WBC 11.7 (H) 09/06/2011 0545   RBC 3.47 (L) 09/06/2011 0545   HGB 11.0 (L) 09/06/2011 0545   HGB 13.0 05/15/2011 0854   HCT 32.4 (L) 09/06/2011 0545   HCT 37.3 05/15/2011 0854   PLT 256 09/06/2011 0545   PLT 257 05/15/2011 0854   MCV 93.4 09/06/2011 0545   MCV 86 05/15/2011 0854   MCH 31.7 09/06/2011 0545   MCHC 34.0 09/06/2011 0545   RDW 11.8 09/06/2011 0545   RDW 15.1 05/15/2011 0854   LYMPHSABS 1.0 06/30/2011 1440   LYMPHSABS 1.1 05/15/2011 0854   MONOABS 0.7 06/30/2011 1440   EOSABS 0.1 06/30/2011 1440   EOSABS 0.1 05/15/2011 0854   BASOSABS 0.0 06/30/2011 1440   BASOSABS 0.0 05/15/2011 0854    No results found for: POCLITH, LITHIUM   No results found for: PHENYTOIN, PHENOBARB, VALPROATE, CBMZ   .res Assessment: Plan:    Plan:  1. Continue Trilafon 8mg  at hs 2. Wellbutrin XL 300mg  every morning 3. Zoloft 50mg  at hs  4. Trazadone 150mg  at hs  Supplements:  MVI Magnesium Vit E Coq10 Fish oil Melatonin B12  Sees Rosary Lively for therapy every 3 months  RTC 6 months  Patient advised to contact office with any questions, adverse effects, or acute worsening in signs and symptoms.  Discussed potential metabolic side effects associated with atypical antipsychotics, as well as potential risk for movement side effects. Advised pt to contact office if movement side effects occur.   Diagnoses and all orders for this visit:  Bipolar  affective disorder in remission ([place]) -     perphenazine (TRILAFON) 8 MG tablet; Take 1 tablet (8 mg total) by mouth daily.  Major depressive disorder, recurrent episode, moderate ([facility]) -     buPROPion (WELLBUTRIN XL) 300 MG 24 hr tablet; Take 1 tablet (300 mg total) by mouth daily. -     sertraline (ZOLOFT) 100 MG tablet; Take 1.5 tablets (150 mg total) by mouth daily.  Generalized  anxiety disorder -     buPROPion (WELLBUTRIN XL) 300 MG 24 hr tablet; Take 1 tablet (300 mg total) by mouth daily. -     sertraline (ZOLOFT) 100 MG tablet; Take 1.5 tablets (150 mg total) by mouth daily.  Insomnia, unspecified type -     traZODone (DESYREL) 50 MG tablet; Take 3 tablets (150 mg total) by mouth at bedtime.  Panic attacks -     sertraline (ZOLOFT) 100 MG tablet; Take 1.5 tablets (150 mg total) by mouth daily.    Please see After Visit Summary for patient specific instructions.  Future Appointments  Date Time Provider [place]  07/24/2021 10:00 AM [facility] DX DEXA 1 [facility]    No orders of the defined types were placed in this encounter.   -------------------------------

## 2021-06-25 ENCOUNTER — Other Ambulatory Visit: Payer: Self-pay | Admitting: Adult Health

## 2021-06-25 DIAGNOSIS — F331 Major depressive disorder, recurrent, moderate: Secondary | ICD-10-CM

## 2021-06-25 DIAGNOSIS — F411 Generalized anxiety disorder: Secondary | ICD-10-CM

## 2021-07-24 ENCOUNTER — Ambulatory Visit
Admission: RE | Admit: 2021-07-24 | Discharge: 2021-07-24 | Disposition: A | Discharge status: Home / Self Care | Payer: PRIVATE HEALTH INSURANCE | Source: Ambulatory Visit | Attending: Endocrinology | Admitting: Endocrinology

## 2021-07-24 ENCOUNTER — Other Ambulatory Visit: Payer: Self-pay

## 2021-07-24 DIAGNOSIS — M8580 Other specified disorders of bone density and structure, unspecified site: Secondary | ICD-10-CM

## 2021-09-13 ENCOUNTER — Other Ambulatory Visit: Payer: Self-pay | Admitting: Adult Health

## 2021-09-13 DIAGNOSIS — F411 Generalized anxiety disorder: Secondary | ICD-10-CM

## 2021-09-13 DIAGNOSIS — F331 Major depressive disorder, recurrent, moderate: Secondary | ICD-10-CM

## 2021-10-19 ENCOUNTER — Ambulatory Visit (INDEPENDENT_AMBULATORY_CARE_PROVIDER_SITE_OTHER): Payer: 59 | Admitting: Adult Health

## 2021-10-19 ENCOUNTER — Other Ambulatory Visit: Payer: Self-pay

## 2021-10-19 ENCOUNTER — Encounter: Payer: Self-pay | Admitting: Adult Health

## 2021-10-19 DIAGNOSIS — F3170 Bipolar disorder, currently in remission, most recent episode unspecified: Secondary | ICD-10-CM | POA: Diagnosis not present

## 2021-10-19 DIAGNOSIS — G4700 Insomnia, unspecified: Secondary | ICD-10-CM

## 2021-10-19 DIAGNOSIS — F410 Panic disorder [episodic paroxysmal anxiety] without agoraphobia: Secondary | ICD-10-CM

## 2021-10-19 DIAGNOSIS — F331 Major depressive disorder, recurrent, moderate: Secondary | ICD-10-CM | POA: Diagnosis not present

## 2021-10-19 DIAGNOSIS — F411 Generalized anxiety disorder: Secondary | ICD-10-CM

## 2021-10-19 MED ORDER — PERPHENAZINE 8 MG PO TABS: 8.0000 mg | ORAL_TABLET | Freq: Every day | ORAL | 5 refills | Status: DC

## 2021-10-19 MED ORDER — SERTRALINE HCL 50 MG PO TABS: ORAL_TABLET | ORAL | 5 refills | Status: DC

## 2021-10-19 MED ORDER — TRAZODONE HCL 150 MG PO TABS: 150.0000 mg | ORAL_TABLET | Freq: Every day | ORAL | 5 refills | Status: DC

## 2021-10-19 MED ORDER — BUPROPION HCL ER (XL) 300 MG PO TB24: 300.0000 mg | ORAL_TABLET | Freq: Every day | ORAL | 5 refills | Status: DC

## 2021-10-19 NOTE — Progress Notes (Signed)
Chloe Dennis [id number] 20-Dec-1960 60 y.o.  Subjective:   Patient ID:  Chloe Dennis is a 60 y.o. (DOB 07/16/1961) female.  Chief Complaint: No chief complaint on file.   HPI Chloe Dennis presents to the office today for follow-up of BPD, MDD, GAD, and insomnia.  Describes mood today as "ok". Pleasant. Mood symptoms - denies depression, anxiety, and irritability. Stating "I'm doing alright". Feels like medications continue to work well. She and husband doing well. Son in [place]. Seeing therapist every 3 to 4 months - Chloe Dennis. Stable interest and motivation. Taking medications as prescribed and feels like they continue to work well.  Energy levels average. Active, does not have a regular exercise routine - plans to restart.  Enjoys some usual interests and activities. Married. Lives with husband of 14 years. Has a son from first marriage - 63 - lives in [place]. Has 5 sisters and 1 brother. Appetite adequate. Weight gain - 145 to 152 pounds. Plans to change diet.  Sleeps well most nights. Averages 6 to 8 hours. Focus and concentration stable. Completing tasks. Managing aspects of household. Works part-time at [employer] x 5 years - 5 days a week.   Denies SI or HI.  Denies AH or VH.  Recent check up with PCP   Review of Systems:  Review of Systems  Musculoskeletal:  Negative for gait problem.  Neurological:  Negative for tremors.  Psychiatric/Behavioral:         Please refer to HPI   Medications: I have reviewed the patient's current medications.  Current Outpatient Medications  Medication Sig Dispense Refill   buPROPion (WELLBUTRIN XL) 300 MG 24 hr tablet Take 1 tablet (300 mg total) by mouth daily. 30 tablet 5   lisinopril (ZESTRIL) 20 MG tablet Take 20 mg by mouth daily.     perphenazine (TRILAFON) 8 MG tablet Take 1 tablet (8 mg total) by mouth daily. 30 tablet 5   sertraline (ZOLOFT) 50 MG tablet Take one tablet daily. 30 tablet 5    simvastatin (ZOCOR) 20 MG tablet Take 20 mg by mouth at bedtime.     traZODone (DESYREL) 150 MG tablet Take 1 tablet (150 mg total) by mouth at bedtime. 30 tablet 5   valACYclovir (VALTREX) 1000 MG tablet Take 1,000 mg by mouth daily.     valACYclovir (VALTREX) 500 MG tablet Take by mouth.     No current facility-administered medications for this visit.    Medication Side Effects: None  Allergies:  Allergies  Allergen Reactions   Other     Other reaction(s): hives   Penicillin G     Other reaction(s): Unknown   Penicillins Hives    whelps Other reaction(s): hives Other reaction(s): hives    Past Medical History:  Diagnosis Date   Anemia    03/2011   Anxiety    no meds   Blood transfusion    2012 in [place]   Depression    no meds, bipolar no meds    Past Medical History, Surgical history, Social history, and Family history were reviewed and updated as appropriate.   Please see review of systems for further details on the patient's review from today.   Objective:   Physical Exam:  LMP 08/09/2011   Physical Exam Constitutional:      General: She is not in acute distress. Musculoskeletal:        General: No deformity.  Neurological:     Mental Status: She is alert and  oriented to person, place, and time.     Coordination: Coordination normal.  Psychiatric:        Attention and Perception: Attention and perception normal. She does not perceive auditory or visual hallucinations.        Mood and Affect: Mood normal. Mood is not anxious or depressed. Affect is not labile, blunt, angry or inappropriate.        Speech: Speech normal.        Behavior: Behavior normal.        Thought Content: Thought content normal. Thought content is not paranoid or delusional. Thought content does not include homicidal or suicidal ideation. Thought content does not include homicidal or suicidal plan.        Cognition and Memory: Cognition and memory normal.        Judgment:  Judgment normal.     Comments: Insight intact    Lab Review:     Component Value Date/Time   NA 139 06/30/2011 1440   K 4.4 06/30/2011 1440   CL 101 06/30/2011 1440   CO2 29 06/30/2011 1440   GLUCOSE 79 06/30/2011 1440   BUN 12 06/30/2011 1440   CREATININE 0.84 06/30/2011 1440   CALCIUM 10.4 06/30/2011 1440   PROT 7.6 06/30/2011 1440   ALBUMIN 3.8 06/30/2011 1440   AST 21 06/30/2011 1440   ALT 19 06/30/2011 1440   ALKPHOS 53 06/30/2011 1440   BILITOT 0.4 06/30/2011 1440   GFRNONAA >60 06/30/2011 1440   GFRAA >60 06/30/2011 1440       Component Value Date/Time   WBC 11.7 (H) 09/06/2011 0545   RBC 3.47 (L) 09/06/2011 0545   HGB 11.0 (L) 09/06/2011 0545   HGB 13.0 05/15/2011 0854   HCT 32.4 (L) 09/06/2011 0545   HCT 37.3 05/15/2011 0854   PLT 256 09/06/2011 0545   PLT 257 05/15/2011 0854   MCV 93.4 09/06/2011 0545   MCV 86 05/15/2011 0854   MCH 31.7 09/06/2011 0545   MCHC 34.0 09/06/2011 0545   RDW 11.8 09/06/2011 0545   RDW 15.1 05/15/2011 0854   LYMPHSABS 1.0 06/30/2011 1440   LYMPHSABS 1.1 05/15/2011 0854   MONOABS 0.7 06/30/2011 1440   EOSABS 0.1 06/30/2011 1440   EOSABS 0.1 05/15/2011 0854   BASOSABS 0.0 06/30/2011 1440   BASOSABS 0.0 05/15/2011 0854    No results found for: POCLITH, LITHIUM   No results found for: PHENYTOIN, PHENOBARB, VALPROATE, CBMZ   .res Assessment: Plan:    Plan:  1. Continue Trilafon 8mg  at hs 2. Wellbutrin XL 300mg  every morning 3. Zoloft 50mg  at hs  4. Trazadone 150mg  at hs  Supplements:  MVI Magnesium Vit E Coq10 Fish oil Melatonin B12  Sees Chloe Dennis for therapy every 3 months  RTC 6 months  Patient advised to contact office with any questions, adverse effects, or acute worsening in signs and symptoms.  Discussed potential metabolic side effects associated with atypical antipsychotics, as well as potential risk for movement side effects. Advised pt to contact office if movement side effects occur.     Diagnoses and all orders for this visit:  Major depressive disorder, recurrent episode, moderate ([facility]) -     buPROPion (WELLBUTRIN XL) 300 MG 24 hr tablet; Take 1 tablet (300 mg total) by mouth daily. -     sertraline (ZOLOFT) 50 MG tablet; Take one tablet daily.  Generalized anxiety disorder -     buPROPion (WELLBUTRIN XL) 300 MG 24 hr tablet; Take 1 tablet (300 mg  total) by mouth daily. -     sertraline (ZOLOFT) 50 MG tablet; Take one tablet daily.  Bipolar affective disorder in remission ([facility]) -     perphenazine (TRILAFON) 8 MG tablet; Take 1 tablet (8 mg total) by mouth daily.  Panic attacks -     sertraline (ZOLOFT) 50 MG tablet; Take one tablet daily.  Insomnia, unspecified type -     traZODone (DESYREL) 150 MG tablet; Take 1 tablet (150 mg total) by mouth at bedtime.     Please see After Visit Summary for patient specific instructions.  Future Appointments  Date Time Provider [place]  04/19/2022 10:00 AM [place], Berdie Ogren, NP [facility] None    No orders of the defined types were placed in this encounter.   -------------------------------

## 2021-10-31 ENCOUNTER — Other Ambulatory Visit: Payer: Self-pay | Admitting: Adult Health

## 2021-10-31 DIAGNOSIS — F410 Panic disorder [episodic paroxysmal anxiety] without agoraphobia: Secondary | ICD-10-CM

## 2021-10-31 DIAGNOSIS — F331 Major depressive disorder, recurrent, moderate: Secondary | ICD-10-CM

## 2021-10-31 DIAGNOSIS — F411 Generalized anxiety disorder: Secondary | ICD-10-CM

## 2021-11-21 ENCOUNTER — Other Ambulatory Visit: Payer: Self-pay | Admitting: Adult Health

## 2021-11-21 DIAGNOSIS — G4700 Insomnia, unspecified: Secondary | ICD-10-CM

## 2021-12-25 ENCOUNTER — Other Ambulatory Visit: Payer: Self-pay | Admitting: Family Medicine

## 2022-01-01 ENCOUNTER — Other Ambulatory Visit: Payer: Self-pay | Admitting: Nurse Practitioner

## 2022-01-01 DIAGNOSIS — N6459 Other signs and symptoms in breast: Secondary | ICD-10-CM

## 2022-01-01 DIAGNOSIS — N6320 Unspecified lump in the left breast, unspecified quadrant: Secondary | ICD-10-CM

## 2022-01-19 ENCOUNTER — Ambulatory Visit: Payer: Managed Care, Other (non HMO)

## 2022-01-19 ENCOUNTER — Ambulatory Visit
Admission: RE | Admit: 2022-01-19 | Discharge: 2022-01-19 | Disposition: A | Discharge status: Home / Self Care | Payer: Managed Care, Other (non HMO) | Source: Ambulatory Visit | Attending: Nurse Practitioner | Admitting: Nurse Practitioner

## 2022-01-19 ENCOUNTER — Other Ambulatory Visit: Payer: Self-pay | Admitting: Nurse Practitioner

## 2022-01-19 DIAGNOSIS — N6320 Unspecified lump in the left breast, unspecified quadrant: Secondary | ICD-10-CM

## 2022-01-19 DIAGNOSIS — N6459 Other signs and symptoms in breast: Secondary | ICD-10-CM

## 2022-01-21 ENCOUNTER — Other Ambulatory Visit: Payer: Self-pay | Admitting: Adult Health

## 2022-01-21 DIAGNOSIS — F411 Generalized anxiety disorder: Secondary | ICD-10-CM

## 2022-01-21 DIAGNOSIS — F331 Major depressive disorder, recurrent, moderate: Secondary | ICD-10-CM

## 2022-03-05 ENCOUNTER — Other Ambulatory Visit: Payer: Self-pay | Admitting: Family Medicine

## 2022-03-05 DIAGNOSIS — E785 Hyperlipidemia, unspecified: Secondary | ICD-10-CM

## 2022-04-13 ENCOUNTER — Other Ambulatory Visit: Payer: Managed Care, Other (non HMO)

## 2022-04-17 ENCOUNTER — Ambulatory Visit
Admission: RE | Admit: 2022-04-17 | Discharge: 2022-04-17 | Disposition: A | Discharge status: Home / Self Care | Payer: No Typology Code available for payment source | Source: Ambulatory Visit | Attending: Family Medicine | Admitting: Family Medicine

## 2022-04-17 DIAGNOSIS — E785 Hyperlipidemia, unspecified: Secondary | ICD-10-CM

## 2022-04-18 ENCOUNTER — Other Ambulatory Visit: Payer: Self-pay | Admitting: Adult Health

## 2022-04-18 DIAGNOSIS — F411 Generalized anxiety disorder: Secondary | ICD-10-CM

## 2022-04-18 DIAGNOSIS — F331 Major depressive disorder, recurrent, moderate: Secondary | ICD-10-CM

## 2022-04-19 ENCOUNTER — Encounter: Payer: Self-pay | Admitting: Adult Health

## 2022-04-19 ENCOUNTER — Ambulatory Visit: Payer: 59 | Admitting: Adult Health

## 2022-04-19 DIAGNOSIS — F3170 Bipolar disorder, currently in remission, most recent episode unspecified: Secondary | ICD-10-CM | POA: Diagnosis not present

## 2022-04-19 DIAGNOSIS — F331 Major depressive disorder, recurrent, moderate: Secondary | ICD-10-CM | POA: Diagnosis not present

## 2022-04-19 DIAGNOSIS — F411 Generalized anxiety disorder: Secondary | ICD-10-CM

## 2022-04-19 DIAGNOSIS — G4700 Insomnia, unspecified: Secondary | ICD-10-CM

## 2022-04-19 MED ORDER — TRAZODONE HCL 150 MG PO TABS: 150.0000 mg | ORAL_TABLET | Freq: Every day | ORAL | 1 refills | Status: DC

## 2022-04-19 MED ORDER — PERPHENAZINE 8 MG PO TABS: 8.0000 mg | ORAL_TABLET | Freq: Every day | ORAL | 5 refills | Status: DC

## 2022-04-19 MED ORDER — BUPROPION HCL ER (XL) 300 MG PO TB24: 300.0000 mg | ORAL_TABLET | Freq: Every day | ORAL | 1 refills | Status: DC

## 2022-04-19 MED ORDER — LAMOTRIGINE 25 MG PO TABS: ORAL_TABLET | ORAL | 2 refills | Status: DC

## 2022-04-19 NOTE — Telephone Encounter (Signed)
Has appt with Barnett Applebaum today.

## 2022-04-19 NOTE — Progress Notes (Signed)
Chloe Dennis [id number] [birth year] 61 y.o.  Subjective:   Patient ID:  Chloe Dennis is a 61 y.o. (DOB [birth year]) female.  Chief Complaint: No chief complaint on file.   HPI Toneka Fullen Matton presents to the office today for follow-up of BPD, MDD, GAD, and insomnia.  Describes mood today as "ok". Pleasant. Mood symptoms - denies depression and irritability. Feeling a little more anxious than she was. Reports elevated mood - has felt manic for a little over a month - having racing thoughts - can't relax enough to get to sleep. Reports having a non-sexual relationship with a co-worker over the past 6 months, but it is over now. 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 Stating "I'm doing as well as I was". Feels like she is struggling to get her mind "settled down". Has increased the dose of Trazadone over the past few weeks to help with sleep. She and husband doing better than they were. Son in [place]. Seeing therapist every 3 to 4 months - Rosary Lively. Stable interest and motivation. Taking medications as prescribed and feels like they continue to work well.  Energy levels average - "a little uppity". Active, does not have a regular exercise routine. Enjoys some usual interests and activities. Married. Lives with husband. Has a son from first marriage - 39 - lives in [place]. Has 5 sisters and 1 brother. Appetite adequate. Weight loss - 147 pounds. She and husband diet. Sleeping difficulties over the past month. Averages 4 to 5 hours. Increased Trazadone '150mg'$  to '200mg'$  at night. Focus and concentration stable. Completing tasks. Managing aspects of household. 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 Denies SI or HI.  Denies AH or VH. Reports racing thoughts - "de ja vous". Denies self harm.   Review of Systems:  Review of Systems  Musculoskeletal:  Negative for gait problem.  Neurological:  Negative for  tremors.  Psychiatric/Behavioral:         Please refer to HPI    Medications: I have reviewed the patient's current medications.  Current Outpatient Medications  Medication Sig Dispense Refill   buPROPion (WELLBUTRIN XL) 300 MG 24 hr tablet TAKE 1 TABLET BY MOUTH EVERY DAY 90 tablet 0   lisinopril (ZESTRIL) 20 MG tablet Take 20 mg by mouth daily.     perphenazine (TRILAFON) 8 MG tablet Take 1 tablet (8 mg total) by mouth daily. 30 tablet 5   sertraline (ZOLOFT) 50 MG tablet TAKE 1 TABLET BY MOUTH EVERY DAY 90 tablet 1   simvastatin (ZOCOR) 20 MG tablet Take 20 mg by mouth at bedtime.     traZODone (DESYREL) 150 MG tablet TAKE 1 TABLET BY MOUTH AT BEDTIME. 90 tablet 1   valACYclovir (VALTREX) 1000 MG tablet Take 1,000 mg by mouth daily.     valACYclovir (VALTREX) 500 MG tablet Take by mouth.     No current facility-administered medications for this visit.    Medication Side Effects: None  Allergies:  Allergies  Allergen Reactions   Other     Other reaction(s): hives   Penicillin G     Other reaction(s): Unknown   Penicillins Hives    whelps Other reaction(s): hives Other reaction(s): hives    Past Medical History:  Diagnosis Date   Anemia    03/2011   Anxiety    no meds   Blood transfusion    2012 in [facility]  Depression    no meds, bipolar no meds    Past Medical History, Surgical history, Social history, and Family history were reviewed and updated as appropriate.   Please see review of systems for further details on the patient's review from today.   Objective:   Physical Exam:  LMP 08/09/2011   Physical Exam Constitutional:      General: She is not in acute distress. Musculoskeletal:        General: No deformity.  Neurological:     Mental Status: She is alert and oriented to person, place, and time.     Coordination: Coordination normal.  Psychiatric:        Attention and Perception: Attention and perception normal. She does not perceive auditory  or visual hallucinations.        Mood and Affect: Mood normal. Mood is not anxious or depressed. Affect is not labile, blunt, angry or inappropriate.        Speech: Speech normal.        Behavior: Behavior normal.        Thought Content: Thought content normal. Thought content is not paranoid or delusional. Thought content does not include homicidal or suicidal ideation. Thought content does not include homicidal or suicidal plan.        Cognition and Memory: Cognition and memory normal.        Judgment: Judgment normal.     Comments: Insight intact     Lab Review:     Component Value Date/Time   NA 139 06/30/2011 1440   K 4.4 06/30/2011 1440   CL 101 06/30/2011 1440   CO2 29 06/30/2011 1440   GLUCOSE 79 06/30/2011 1440   BUN 12 06/30/2011 1440   CREATININE 0.84 06/30/2011 1440   CALCIUM 10.4 06/30/2011 1440   PROT 7.6 06/30/2011 1440   ALBUMIN 3.8 06/30/2011 1440   AST 21 06/30/2011 1440   ALT 19 06/30/2011 1440   ALKPHOS 53 06/30/2011 1440   BILITOT 0.4 06/30/2011 1440   GFRNONAA >60 06/30/2011 1440   GFRAA >60 06/30/2011 1440       Component Value Date/Time   WBC 11.7 (H) 09/06/2011 0545   RBC 3.47 (L) 09/06/2011 0545   HGB 11.0 (L) 09/06/2011 0545   HGB 13.0 05/15/2011 0854   HCT 32.4 (L) 09/06/2011 0545   HCT 37.3 05/15/2011 0854   PLT 256 09/06/2011 0545   PLT 257 05/15/2011 0854   MCV 93.4 09/06/2011 0545   MCV 86 05/15/2011 0854   MCH 31.7 09/06/2011 0545   MCHC 34.0 09/06/2011 0545   RDW 11.8 09/06/2011 0545   RDW 15.1 05/15/2011 0854   LYMPHSABS 1.0 06/30/2011 1440   LYMPHSABS 1.1 05/15/2011 0854   MONOABS 0.7 06/30/2011 1440   EOSABS 0.1 06/30/2011 1440   EOSABS 0.1 05/15/2011 0854   BASOSABS 0.0 06/30/2011 1440   BASOSABS 0.0 05/15/2011 0854    No results found for: "POCLITH", "LITHIUM"   No results found for: "PHENYTOIN", "PHENOBARB", "VALPROATE", "CBMZ"   .res Assessment: Plan:    Plan:  1. Continue Trilafon '8mg'$  at hs 2. Wellbutrin XL  '300mg'$  every morning 3. Trazadone '150mg'$  at hs 4. Add Lamictal '25mg'$  at hs x 14 days, then increase to '50mg'$  at hs  Supplements:  MVI Magnesium Vit E Coq10 Fish oil Melatonin B12  Sees Rosary Lively for therapy every 3 months  RTC 6 months  Patient advised to contact office with any questions, adverse effects, or acute worsening in signs and symptoms.  Discussed potential metabolic side effects associated with atypical antipsychotics, as well as potential risk for movement side effects. Advised pt to contact office if movement side effects occur.   Counseled patient regarding potential benefits, risks, and side effects of Lamictal to include potential risk of Stevens-Johnson syndrome. Advised patient to stop taking Lamictal and contact office immediately if rash develops and to seek urgent medical attention if rash is severe and/or spreading quickly. Will start Lamictal 25 mg daily for 2 weeks, then increase to 50 mg daily for 2 weeks.  There are no diagnoses linked to this encounter.   Please see After Visit Summary for patient specific instructions.  No future appointments.  No orders of the defined types were placed in this encounter.   -------------------------------

## 2022-05-04 ENCOUNTER — Telehealth: Payer: Self-pay | Admitting: Adult Health

## 2022-05-04 ENCOUNTER — Other Ambulatory Visit: Payer: Self-pay | Admitting: Adult Health

## 2022-05-04 DIAGNOSIS — G4700 Insomnia, unspecified: Secondary | ICD-10-CM

## 2022-05-04 MED ORDER — CLONAZEPAM 0.5 MG PO TABS: ORAL_TABLET | ORAL | 0 refills | Status: DC

## 2022-05-04 NOTE — Telephone Encounter (Signed)
Called and spoke with patient.

## 2022-05-04 NOTE — Telephone Encounter (Signed)
Pt stated she has insomnia already but the past few weeks it has been bad.Her trazodone is not helping she takes at 8:30 and never falls asleep.She does not know what has caused the insomnia.She said you mentioned adding klonopin to help sleep.

## 2022-05-04 NOTE — Telephone Encounter (Signed)
Pt LVM @ 1:16a.  She said she is not able to sleep.  She needs something to help her go to sleep.  She is tensed up.  Next appt 7/27

## 2022-05-17 ENCOUNTER — Ambulatory Visit: Payer: 59 | Admitting: Adult Health

## 2022-05-17 ENCOUNTER — Encounter: Payer: Self-pay | Admitting: Adult Health

## 2022-05-17 DIAGNOSIS — F331 Major depressive disorder, recurrent, moderate: Secondary | ICD-10-CM

## 2022-05-17 DIAGNOSIS — F3170 Bipolar disorder, currently in remission, most recent episode unspecified: Secondary | ICD-10-CM | POA: Diagnosis not present

## 2022-05-17 DIAGNOSIS — G4700 Insomnia, unspecified: Secondary | ICD-10-CM

## 2022-05-17 DIAGNOSIS — F411 Generalized anxiety disorder: Secondary | ICD-10-CM

## 2022-05-17 MED ORDER — CLONAZEPAM 0.5 MG PO TABS: ORAL_TABLET | ORAL | 2 refills | Status: DC

## 2022-05-17 NOTE — Progress Notes (Signed)
Chloe Dennis [id number] 1961-06-25 61 y.o.  Subjective:   Patient ID:  Chloe Dennis is a 61 y.o. (DOB 1961-05-02) female.  Chief Complaint: No chief complaint on file.   HPI Chloe Dennis presents to the office today for follow-up of BPD, MDD, GAD, and insomnia.  Describes mood today as "ok". Pleasant. Mood symptoms - denies depression, anxiety, and irritability. Mood is consistent. Stating "I'm doing alright". Feels like medications continue to work well. She and husband doing well. Son in [place]. Seeing therapist every 3 to 4 months - Rosary Lively. Stable interest and motivation. Taking medications as prescribed and feels like they continue to work well.  Energy levels average. Active, does not have a regular exercise routine.  Enjoys some usual interests and activities. Married. Lives with husband. Has a son from first marriage - 35 - lives in [place]. Has 5 sisters and 1 brother. Appetite adequate. Weight loss - 143 pounds. Sleeps well most nights. Averages 6 to 8 hours. Focus and concentration stable. Completing tasks. Managing aspects of household. Works part-time at [employer] x 5 years - 5 days a week.   Denies SI or HI.  Denies AH or VH.  Recent check up with PCP       Review of Systems:  Review of Systems  Musculoskeletal:  Negative for gait problem.  Neurological:  Negative for tremors.  Psychiatric/Behavioral:         Please refer to HPI    Medications: I have reviewed the patient's current medications.  Current Outpatient Medications  Medication Sig Dispense Refill   buPROPion (WELLBUTRIN XL) 300 MG 24 hr tablet Take 1 tablet (300 mg total) by mouth daily. 90 tablet 1   clonazePAM (KLONOPIN) 0.5 MG tablet Take one to two tablets at bedtime as needed for sleep. 60 tablet 2   lisinopril (ZESTRIL) 20 MG tablet Take 20 mg by mouth daily.     perphenazine (TRILAFON) 8 MG tablet Take 1 tablet (8 mg total) by mouth daily. 30 tablet 5    simvastatin (ZOCOR) 20 MG tablet Take 20 mg by mouth at bedtime.     traZODone (DESYREL) 150 MG tablet Take 1 tablet (150 mg total) by mouth at bedtime. 90 tablet 1   valACYclovir (VALTREX) 1000 MG tablet Take 1,000 mg by mouth daily.     valACYclovir (VALTREX) 500 MG tablet Take by mouth.     No current facility-administered medications for this visit.    Medication Side Effects: None  Allergies:  Allergies  Allergen Reactions   Other     Other reaction(s): hives   Penicillin G     Other reaction(s): Unknown   Penicillins Hives    whelps Other reaction(s): hives Other reaction(s): hives Other reaction(s): hives   Simvastatin     Other reaction(s): Myalgias    Past Medical History:  Diagnosis Date   Anemia    03/2011   Anxiety    no meds   Blood transfusion    2012 in [place]   Depression    no meds, bipolar no meds    Past Medical History, Surgical history, Social history, and Family history were reviewed and updated as appropriate.   Please see review of systems for further details on the patient's review from today.   Objective:   Physical Exam:  LMP 08/09/2011   Physical Exam Constitutional:      General: She is not in acute distress. Musculoskeletal:        General:  No deformity.  Neurological:     Mental Status: She is alert and oriented to person, place, and time.     Coordination: Coordination normal.  Psychiatric:        Attention and Perception: Attention and perception normal. She does not perceive auditory or visual hallucinations.        Mood and Affect: Mood normal. Mood is not anxious or depressed. Affect is not labile, blunt, angry or inappropriate.        Speech: Speech normal.        Behavior: Behavior normal.        Thought Content: Thought content normal. Thought content is not paranoid or delusional. Thought content does not include homicidal or suicidal ideation. Thought content does not include homicidal or suicidal plan.         Cognition and Memory: Cognition and memory normal.        Judgment: Judgment normal.     Comments: Insight intact     Lab Review:     Component Value Date/Time   NA 139 06/30/2011 1440   K 4.4 06/30/2011 1440   CL 101 06/30/2011 1440   CO2 29 06/30/2011 1440   GLUCOSE 79 06/30/2011 1440   BUN 12 06/30/2011 1440   CREATININE 0.84 06/30/2011 1440   CALCIUM 10.4 06/30/2011 1440   PROT 7.6 06/30/2011 1440   ALBUMIN 3.8 06/30/2011 1440   AST 21 06/30/2011 1440   ALT 19 06/30/2011 1440   ALKPHOS 53 06/30/2011 1440   BILITOT 0.4 06/30/2011 1440   GFRNONAA >60 06/30/2011 1440   GFRAA >60 06/30/2011 1440       Component Value Date/Time   WBC 11.7 (H) 09/06/2011 0545   RBC 3.47 (L) 09/06/2011 0545   HGB 11.0 (L) 09/06/2011 0545   HGB 13.0 05/15/2011 0854   HCT 32.4 (L) 09/06/2011 0545   HCT 37.3 05/15/2011 0854   PLT 256 09/06/2011 0545   PLT 257 05/15/2011 0854   MCV 93.4 09/06/2011 0545   MCV 86 05/15/2011 0854   MCH 31.7 09/06/2011 0545   MCHC 34.0 09/06/2011 0545   RDW 11.8 09/06/2011 0545   RDW 15.1 05/15/2011 0854   LYMPHSABS 1.0 06/30/2011 1440   LYMPHSABS 1.1 05/15/2011 0854   MONOABS 0.7 06/30/2011 1440   EOSABS 0.1 06/30/2011 1440   EOSABS 0.1 05/15/2011 0854   BASOSABS 0.0 06/30/2011 1440   BASOSABS 0.0 05/15/2011 0854    No results found for: "POCLITH", "LITHIUM"   No results found for: "PHENYTOIN", "PHENOBARB", "VALPROATE", "CBMZ"   .res Assessment: Plan:    Plan:  1. Continue Trilafon '8mg'$  at hs 2. Wellbutrin XL '300mg'$  every morning 3. Trazadone '150mg'$  at hs 4. D/C Lamictal '50mg'$  at hs 5. Add Clonazepam 0.'5mg'$  - take one to two tablets at bedtime  Supplements:  MVI Magnesium Coq10 Fish oil Melatonin B12  Sees Rosary Lively for therapy as needed.  RTC 6 months  Patient advised to contact office with any questions, adverse effects, or acute worsening in signs and symptoms.  Discussed potential metabolic side effects associated with  atypical antipsychotics, as well as potential risk for movement side effects. Advised pt to contact office if movement side effects occur.   Diagnoses and all orders for this visit:  Bipolar affective disorder in remission ([place])  Insomnia, unspecified type -     clonazePAM (KLONOPIN) 0.5 MG tablet; Take one to two tablets at bedtime as needed for sleep.  Major depressive disorder, recurrent episode, moderate ([facility])  Generalized anxiety  disorder     Please see After Visit Summary for patient specific instructions.  No future appointments.  No orders of the defined types were placed in this encounter.   -------------------------------

## 2022-07-14 ENCOUNTER — Other Ambulatory Visit: Payer: Self-pay | Admitting: Adult Health

## 2022-07-14 DIAGNOSIS — F3170 Bipolar disorder, currently in remission, most recent episode unspecified: Secondary | ICD-10-CM

## 2022-09-30 ENCOUNTER — Other Ambulatory Visit: Payer: Self-pay | Admitting: Adult Health

## 2022-09-30 DIAGNOSIS — F331 Major depressive disorder, recurrent, moderate: Secondary | ICD-10-CM

## 2022-09-30 DIAGNOSIS — F411 Generalized anxiety disorder: Secondary | ICD-10-CM

## 2022-10-06 ENCOUNTER — Other Ambulatory Visit: Payer: Self-pay | Admitting: Adult Health

## 2022-10-06 DIAGNOSIS — F3170 Bipolar disorder, currently in remission, most recent episode unspecified: Secondary | ICD-10-CM

## 2022-10-29 ENCOUNTER — Other Ambulatory Visit: Payer: Self-pay | Admitting: Family Medicine

## 2022-10-29 DIAGNOSIS — R9389 Abnormal findings on diagnostic imaging of other specified body structures: Secondary | ICD-10-CM

## 2022-11-07 ENCOUNTER — Other Ambulatory Visit: Payer: Managed Care, Other (non HMO)

## 2022-11-12 ENCOUNTER — Encounter: Payer: Self-pay | Admitting: Adult Health

## 2022-11-12 ENCOUNTER — Other Ambulatory Visit: Payer: Self-pay | Admitting: Adult Health

## 2022-11-12 ENCOUNTER — Ambulatory Visit (INDEPENDENT_AMBULATORY_CARE_PROVIDER_SITE_OTHER): Payer: 59 | Admitting: Adult Health

## 2022-11-12 DIAGNOSIS — F411 Generalized anxiety disorder: Secondary | ICD-10-CM

## 2022-11-12 DIAGNOSIS — G4700 Insomnia, unspecified: Secondary | ICD-10-CM

## 2022-11-12 DIAGNOSIS — F3170 Bipolar disorder, currently in remission, most recent episode unspecified: Secondary | ICD-10-CM

## 2022-11-12 MED ORDER — PERPHENAZINE 4 MG PO TABS: ORAL_TABLET | ORAL | 3 refills | Status: DC

## 2022-11-12 MED ORDER — CLONAZEPAM 0.5 MG PO TABS: ORAL_TABLET | ORAL | 2 refills | Status: DC

## 2022-11-12 MED ORDER — TRAZODONE HCL 150 MG PO TABS: 150.0000 mg | ORAL_TABLET | Freq: Every day | ORAL | 3 refills | Status: DC

## 2022-11-12 MED ORDER — BUPROPION HCL ER (XL) 300 MG PO TB24: 300.0000 mg | ORAL_TABLET | Freq: Every day | ORAL | 3 refills | Status: DC

## 2022-11-12 NOTE — Progress Notes (Signed)
Chloe Dennis [id number] [birth year] 62 y.o.  Subjective:   Patient ID:  Chloe Dennis is a 62 y.o. (DOB 07/07/1961) female.  Chief Complaint: No chief complaint on file.   HPI Cece Milhouse Micalizzi presents to the office today for follow-up of BPD, MDD, GAD, and insomnia.  Describes mood today as "ok". Pleasant. Mood symptoms - denies depression, anxiety, and irritability. Mood is consistent. Stating "I feel like I'm doing alright". Feels like medications continue to work well - would like to try and increase dose of Trilafon - "I'm not sure if I need it". She and husband doing well. Son in [place]. Seeing therapist every 3 to 4 months - Rosary Lively. Stable interest and motivation. Taking medications as prescribed and feels like they continue to work well.  Energy levels average. Active, does not have a regular exercise routine.  Enjoys some usual interests and activities. Married. Lives with husband. Has a son from first marriage - 83 - lives in [place]. Has 5 sisters and 1 brother. Appetite adequate. Weight loss - 143 pounds. Sleeps well most nights. Averages 6 to 8 hours. Focus and concentration stable. Completing tasks. Managing aspects of household. Works part-time at [employer] x 4 years - 5 days a week.   Denies SI or HI.  Denies AH or VH. Denies self harm. Denies substance use.  Recent check up with PCP       Review of Systems:  Review of Systems  Musculoskeletal:  Negative for gait problem.  Neurological:  Negative for tremors.  Psychiatric/Behavioral:         Please refer to HPI    Medications: I have reviewed the patient's current medications.  Current Outpatient Medications  Medication Sig Dispense Refill   buPROPion (WELLBUTRIN XL) 300 MG 24 hr tablet TAKE 1 TABLET BY MOUTH EVERY DAY 90 tablet 0   clonazePAM (KLONOPIN) 0.5 MG tablet Take one to two tablets at bedtime as needed for sleep. 60 tablet 2   lisinopril (ZESTRIL) 20 MG tablet Take  20 mg by mouth daily.     perphenazine (TRILAFON) 8 MG tablet Take 1 tablet (8 mg total) by mouth daily. 30 tablet 5   simvastatin (ZOCOR) 20 MG tablet Take 20 mg by mouth at bedtime.     traZODone (DESYREL) 150 MG tablet Take 1 tablet (150 mg total) by mouth at bedtime. 90 tablet 1   valACYclovir (VALTREX) 1000 MG tablet Take 1,000 mg by mouth daily.     valACYclovir (VALTREX) 500 MG tablet Take by mouth.     No current facility-administered medications for this visit.    Medication Side Effects: None  Allergies:  Allergies  Allergen Reactions   Other     Other reaction(s): hives   Penicillin G     Other reaction(s): Unknown   Penicillins Hives    whelps Other reaction(s): hives Other reaction(s): hives Other reaction(s): hives   Simvastatin     Other reaction(s): Myalgias    Past Medical History:  Diagnosis Date   Anemia    03/2011   Anxiety    no meds   Blood transfusion    2012 in [place]   Depression    no meds, bipolar no meds    Past Medical History, Surgical history, Social history, and Family history were reviewed and updated as appropriate.   Please see review of systems for further details on the patient's review from today.   Objective:   Physical Exam:  LMP 08/09/2011  Physical Exam Constitutional:      General: She is not in acute distress. Musculoskeletal:        General: No deformity.  Neurological:     Mental Status: She is alert and oriented to person, place, and time.     Coordination: Coordination normal.  Psychiatric:        Attention and Perception: Attention and perception normal. She does not perceive auditory or visual hallucinations.        Mood and Affect: Mood normal. Mood is not anxious or depressed. Affect is not labile, blunt, angry or inappropriate.        Speech: Speech normal.        Behavior: Behavior normal.        Thought Content: Thought content normal. Thought content is not paranoid or delusional. Thought content  does not include homicidal or suicidal ideation. Thought content does not include homicidal or suicidal plan.        Cognition and Memory: Cognition and memory normal.        Judgment: Judgment normal.     Comments: Insight intact     Lab Review:     Component Value Date/Time   NA 139 06/30/2011 1440   K 4.4 06/30/2011 1440   CL 101 06/30/2011 1440   CO2 29 06/30/2011 1440   GLUCOSE 79 06/30/2011 1440   BUN 12 06/30/2011 1440   CREATININE 0.84 06/30/2011 1440   CALCIUM 10.4 06/30/2011 1440   PROT 7.6 06/30/2011 1440   ALBUMIN 3.8 06/30/2011 1440   AST 21 06/30/2011 1440   ALT 19 06/30/2011 1440   ALKPHOS 53 06/30/2011 1440   BILITOT 0.4 06/30/2011 1440   GFRNONAA >60 06/30/2011 1440   GFRAA >60 06/30/2011 1440       Component Value Date/Time   WBC 11.7 (H) 09/06/2011 0545   RBC 3.47 (L) 09/06/2011 0545   HGB 11.0 (L) 09/06/2011 0545   HGB 13.0 05/15/2011 0854   HCT 32.4 (L) 09/06/2011 0545   HCT 37.3 05/15/2011 0854   PLT 256 09/06/2011 0545   PLT 257 05/15/2011 0854   MCV 93.4 09/06/2011 0545   MCV 86 05/15/2011 0854   MCH 31.7 09/06/2011 0545   MCHC 34.0 09/06/2011 0545   RDW 11.8 09/06/2011 0545   RDW 15.1 05/15/2011 0854   LYMPHSABS 1.0 06/30/2011 1440   LYMPHSABS 1.1 05/15/2011 0854   MONOABS 0.7 06/30/2011 1440   EOSABS 0.1 06/30/2011 1440   EOSABS 0.1 05/15/2011 0854   BASOSABS 0.0 06/30/2011 1440   BASOSABS 0.0 05/15/2011 0854    No results found for: "POCLITH", "LITHIUM"   No results found for: "PHENYTOIN", "PHENOBARB", "VALPROATE", "CBMZ"   .res Assessment: Plan:    Plan:  1. Continue Trilafon '8mg'$  at hs 2. Wellbutrin XL '300mg'$  every morning 3. Trazadone '150mg'$  at hs - taking 1/2 tablet at bedtime 4. Clonazepam 0.'5mg'$  - take one to two tablets at bedtime  Supplements:  MVI Magnesium Coq10 Fish oil Melatonin B12  Sees Rosary Lively for therapy as needed.  RTC 6 months  Patient advised to contact office with any questions, adverse  effects, or acute worsening in signs and symptoms.  Discussed potential metabolic side effects associated with atypical antipsychotics, as well as potential risk for movement side effects. Advised pt to contact office if movement side effects occur.   There are no diagnoses linked to this encounter.   Please see After Visit Summary for patient specific instructions.  Future Appointments  Date Time Provider Department  Center  11/12/2022 10:40 AM Tristan Bramble, Berdie Ogren, NP [facility] None  11/23/2022  1:20 PM [facility] 1 [facility] [address] [facility]    No orders of the defined types were placed in this encounter.   -------------------------------

## 2022-11-23 ENCOUNTER — Ambulatory Visit
Admission: RE | Admit: 2022-11-23 | Discharge: 2022-11-23 | Disposition: A | Discharge status: Home / Self Care | Payer: Managed Care, Other (non HMO) | Source: Ambulatory Visit | Attending: Family Medicine | Admitting: Family Medicine

## 2022-11-23 DIAGNOSIS — R9389 Abnormal findings on diagnostic imaging of other specified body structures: Secondary | ICD-10-CM

## 2023-01-16 ENCOUNTER — Encounter: Payer: Self-pay | Admitting: Psychiatry

## 2023-01-16 ENCOUNTER — Ambulatory Visit (INDEPENDENT_AMBULATORY_CARE_PROVIDER_SITE_OTHER): Payer: 59 | Admitting: Psychiatry

## 2023-01-16 DIAGNOSIS — F3170 Bipolar disorder, currently in remission, most recent episode unspecified: Secondary | ICD-10-CM
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
## 2023-02-12 ENCOUNTER — Other Ambulatory Visit: Payer: Self-pay | Admitting: Nurse Practitioner

## 2023-02-12 DIAGNOSIS — Z1231 Encounter for screening mammogram for malignant neoplasm of breast: Secondary | ICD-10-CM

## 2023-02-19 ENCOUNTER — Ambulatory Visit
Admission: RE | Admit: 2023-02-19 | Discharge: 2023-02-19 | Disposition: A | Discharge status: Home / Self Care | Payer: Managed Care, Other (non HMO) | Source: Ambulatory Visit | Attending: Nurse Practitioner | Admitting: Nurse Practitioner

## 2023-02-19 DIAGNOSIS — Z1231 Encounter for screening mammogram for malignant neoplasm of breast: Secondary | ICD-10-CM

## 2023-03-13 ENCOUNTER — Ambulatory Visit (INDEPENDENT_AMBULATORY_CARE_PROVIDER_SITE_OTHER): Payer: 59 | Admitting: Psychiatry

## 2023-03-13 DIAGNOSIS — F3170 Bipolar disorder, currently in remission, most recent episode unspecified: Secondary | ICD-10-CM | POA: Diagnosis not present
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
## 2023-04-01 ENCOUNTER — Telehealth: Payer: Self-pay | Admitting: Adult Health

## 2023-04-01 NOTE — Telephone Encounter (Signed)
Please call patient to schedule an earlier appt with Gina.  

## 2023-04-01 NOTE — Telephone Encounter (Signed)
Patient called in stating that she is "thinking." Had a problem with this some time last year. She has put herself back on Lamitcal and she is taking as she should. Has also been stressing and dealing with a lot going on all at once. She would like a return call ASAP. Ph: [phone] Appt 7/22

## 2023-04-01 NOTE — Telephone Encounter (Signed)
Pt coming in on friday

## 2023-04-01 NOTE — Telephone Encounter (Signed)
Patient has enough medication to get her to her appt on Friday.

## 2023-04-01 NOTE — Telephone Encounter (Signed)
Patient reports she can't stop thinking.  She said she wasn't having racing thoughts, just couldn't stop thinking about things. She is unable to work or drive due to this. She reported the same sx this time last year and was started on Lamictal. She had some left and started taking 25 mg on Friday; 25 mg for 14 days and then increase to 50. She said she takes the Lamictal at night and it helps her sleep, but she doesn't have anything for daytime.  Doesn't describe mania. Affect does not seem flat.

## 2023-04-01 NOTE — Telephone Encounter (Signed)
Does she need a new prescription?

## 2023-04-03 ENCOUNTER — Telehealth: Payer: Self-pay | Admitting: Adult Health

## 2023-04-03 NOTE — Telephone Encounter (Signed)
Patient's husband Chloe Dennis called into office stating that Chloe Dennis has been crying and depressed and just in not great condition. He would like someone to return call ASAP. Has appt scheduled with [facility] on Friday but doesn't think she can wait that long. Ph: [phone]

## 2023-04-03 NOTE — Telephone Encounter (Signed)
We can refer to [facility] if needed. Any SI or HI? Since she just started the Lamictal it will take some time to see mood improvement.

## 2023-04-03 NOTE — Telephone Encounter (Signed)
Husband called reporting Chloe Dennis has been crying and depressed and just in not great condition.  She has appt with you Friday.

## 2023-04-03 NOTE — Telephone Encounter (Signed)
Please see message and message from 6/10. She just started back on Lamictal. She does have clonazepam for sleep.

## 2023-04-03 NOTE — Telephone Encounter (Signed)
Patient doesn't feel she needs to go to [facility] or ER. She has an appt tomorrow with Jeanice Lim at 10:00 and an appt with Almira Coaster on Friday. Husband thought she had settled down, but when I talked with patient she said she hadn't settled down, she had just stopped crying.  Told her the medication had not kicked in yet, which made her feel some better, knowing that she wasn't going to always feel the way she is currently.

## 2023-04-04 ENCOUNTER — Telehealth: Payer: Self-pay | Admitting: Psychiatry

## 2023-04-04 ENCOUNTER — Telehealth (INDEPENDENT_AMBULATORY_CARE_PROVIDER_SITE_OTHER): Payer: 59 | Admitting: Psychiatry

## 2023-04-04 DIAGNOSIS — F3170 Bipolar disorder, currently in remission, most recent episode unspecified: Secondary | ICD-10-CM | POA: Diagnosis not present

## 2023-04-04 NOTE — Progress Notes (Signed)
[facility] Counselor/Therapist Progress Note  Patient ID: Chloe Dennis, MRN: [id number],    Date: 04/04/2023  Time Spent: 50 minutes start time 10:07 AM end time 10:57 AM Virtual Visit via video Note Connected with patient by a telemedicine/telehealth application, with their informed consent, and verified patient privacy and that I am speaking with the correct person using two identifiers. I discussed the limitations, risks, security and privacy concerns of performing psychotherapy and the availability of in person appointments. I also discussed with the patient that there may be a patient responsible charge related to this service. The patient expressed understanding and agreed to proceed. I discussed the treatment planning with the patient. The patient was provided an opportunity to ask questions and all were answered. The patient agreed with the plan and demonstrated an understanding of the instructions. The patient was advised to call  our office if  symptoms worsen or feel they are in a crisis state and need immediate contact.   Therapist Location: office Patient Location: home    Treatment Type: Individual Therapy  Reported Symptoms: depressed, anxiety, triggered responses, rumination, sleep issues, focusing issues, panic  Mental Status Exam:  Appearance:   Casual     Behavior:  Appropriate  Motor:  Normal  Speech/Language:   Normal Rate  Affect:  Appropriate  Mood:  anxious and sad  Thought process:  normal  Thought content:    WNL  Sensory/Perceptual disturbances:    WNL  Orientation:  oriented to person, place, time/date, and situation  Attention:  Good  Concentration:  Good  Memory:  WNL  Fund of knowledge:   Good  Insight:    Good  Judgment:   Good  Impulse Control:  Good   Risk Assessment: Danger to Self:  No Self-injurious Behavior: No Danger to Others: No Duty to Warn:no Physical Aggression / Violence:No  Access to Firearms a concern: No  Gang  Involvement:No   Subjective: Met with patient via virtual session.  She was unable to come in for a session due to being sick but was very upset and agitated and needed to be working for session.  She shared that she was sad over ending the relationship with her friend. Discussed how the ending of it was a grief.  She shared she has been ruminating over everything and can't focus or accomplish tasks.  She was also very concerned that she was feeling numb. She went on to share that her oldest sister recently passed away and she has gotten tearful over that situation.  She shared that she won't be able to go to the funeral due to work.She shared she has been having lots of fear of being stuck and it has gotten  to the point that she cries. She shared she has been walking because the doctor told her to but she has still struggled, Encouraged pt to think through what she is learning from the whole experience.  She shared she has to get her feelings out and not hold thing in too much. She went on to share that there was also financial stress over the past few weeks and she just stuffed it down as well. She is also seeing that working typically is helpful for her. She went on to share that she starting being afraid she was going to be the ways she was when she had to have the hysterectomy.  Patient also shared that a year ago she felt stuck and she had lots of panic  at that time.  Did processing set on that time. SUDS level 7, "It's my fault" numb in her stomach.  Patient struggled staying focused on the processing.  She was able to share she just wanted things to move in a better direction.  Patient was finally able to reduce suds level.  Through processing she was able to recognize that some of what is going on is appropriate and her medication is calming down her emotions not just her thoughts.  The importance of recognizing that her brain is helping her not get overwhelmed and panicked all the time and so going numb  is a defense mechanism that it is using to try and help her.  Patient reported feeling better as she was recognizing that it was a defense mechanism rather than something that she would be stuck in forever.  Patient agreed to continue working with her provider Yvette Rack [facility] NP DNP on medication.  Interventions: Solution-Oriented/Positive Psychology, Eye Movement Desensitization and Reprocessing (EMDR), and Insight-Oriented  Diagnosis:   ICD-10-CM   1. Bipolar affective disorder in remission [facility])  F31.70       Plan: Patient is to see provider Yvette Rack DMHNP DNP on 04/05/2023 for medication assessment.  Patient is to remind herself that her brain is doing what it needs to do to keep herself at a good place.  She is to continue to walk to release negative emotions appropriately.  She is to spend time with her dogs pending them to help decrease negative emotions.  Patient will develop treatment plan and set goals at next session.  Stevphen Meuse, [facility]

## 2023-04-04 NOTE — Telephone Encounter (Signed)
[name], holleran are scheduled for a virtual visit with your provider today.    Just as we do with appointments in the office, we must obtain your consent to participate.  Your consent will be active for this visit and any virtual visit you may have with one of our providers in the next 365 days.    If you have a MyChart account, I can also send a copy of this consent to you electronically.  All virtual visits are billed to your insurance company just like a traditional visit in the office.  As this is a virtual visit, video technology does not allow for your provider to perform a traditional examination.  This may limit your provider's ability to fully assess your condition.  If your provider identifies any concerns that need to be evaluated in person or the need to arrange testing such as labs, EKG, etc, we will make arrangements to do so.    Although advances in technology are sophisticated, we cannot ensure that it will always work on either your end or our end.  If the connection with a video visit is poor, we may have to switch to a telephone visit.  With either a video or telephone visit, we are not always able to ensure that we have a secure connection.   I need to obtain your verbal consent now.   Are you willing to proceed with your visit today?   Chloe Dennis has provided verbal consent on 04/04/2023 for a virtual visit (video or telephone).   Stevphen Meuse, [facility] 04/04/2023  10:09 AM

## 2023-04-05 ENCOUNTER — Ambulatory Visit (INDEPENDENT_AMBULATORY_CARE_PROVIDER_SITE_OTHER): Payer: 59 | Admitting: Adult Health

## 2023-04-05 ENCOUNTER — Encounter: Payer: Self-pay | Admitting: Adult Health

## 2023-04-05 ENCOUNTER — Other Ambulatory Visit: Payer: Self-pay | Admitting: Adult Health

## 2023-04-05 DIAGNOSIS — F3170 Bipolar disorder, currently in remission, most recent episode unspecified: Secondary | ICD-10-CM | POA: Diagnosis not present

## 2023-04-05 DIAGNOSIS — G4700 Insomnia, unspecified: Secondary | ICD-10-CM

## 2023-04-05 DIAGNOSIS — F411 Generalized anxiety disorder: Secondary | ICD-10-CM | POA: Diagnosis not present

## 2023-04-05 DIAGNOSIS — F410 Panic disorder [episodic paroxysmal anxiety] without agoraphobia: Secondary | ICD-10-CM

## 2023-04-05 MED ORDER — PERPHENAZINE 4 MG PO TABS
ORAL_TABLET | ORAL | 3 refills | Status: DC
Start: 2023-04-05 — End: 2023-08-05

## 2023-04-05 MED ORDER — CLONAZEPAM 0.5 MG PO TABS
ORAL_TABLET | ORAL | 2 refills | Status: DC
Start: 2023-04-05 — End: 2023-08-05

## 2023-04-05 MED ORDER — LAMOTRIGINE 25 MG PO TABS
ORAL_TABLET | ORAL | 2 refills | Status: DC
Start: 2023-04-05 — End: 2023-05-03

## 2023-04-05 NOTE — Telephone Encounter (Signed)
Has appt today

## 2023-04-05 NOTE — Progress Notes (Signed)
Chloe Dennis [id number] 10/04/1961 62 y.o.  Subjective:   Patient ID:  Chloe Dennis is a 62 y.o. (DOB [birth year]) female.  Chief Complaint: No chief complaint on file.   HPI Chloe Dennis presents to the office today for follow-up of  BPD, panic attacks, GAD, and insomnia.  Describes mood today as "ok". Pleasant. Mood symptoms - denies depression, anxiety, and irritability. Stating "I don't feel anything, I have no emotions". Reports racing thoughts - can't stop thinking - can't concentrate or focus. Reports she stopped the Trilafon since last visit. Recently restarted Lamictal - now at 25mg  daily (helping some) - planning to continue for 2 weeks, and will then increase to 50mg  daily.  Also reports recently lost her sister. Feels like medications continue to work well - would like to try and decrease dose of Trilafon - "I'm not sure if I need it".   Energy levels stable. Active, does not have a regular exercise routine.  Enjoys some usual interests and activities. Married. Lives with husband. Has a son from first marriage - 70 - lives in [place].   Appetite adequate. Weight loss - 140 pounds. Reports sleeping better some nights than others.  Focus and concentration stable. Completing tasks. Managing aspects of household. Works part-time at [employer] x 4 years - 5 days a week - unable to work currently.   Denies SI or HI.  Denies AH or VH. Denies self harm. Denies substance use.  Review of Systems:  Review of Systems  Musculoskeletal:  Negative for gait problem.  Neurological:  Negative for tremors.  Psychiatric/Behavioral:         Please refer to HPI    Medications: I have reviewed the patient's current medications.  Current Outpatient Medications  Medication Sig Dispense Refill   lamoTRIgine (LAMICTAL) 25 MG tablet Take one tablet at bedtime for 14 days, then increase to two tablets at bedtime. 60 tablet 2   buPROPion (WELLBUTRIN XL) 300 MG 24 hr  tablet Take 1 tablet (300 mg total) by mouth daily. 90 tablet 3   clonazePAM (KLONOPIN) 0.5 MG tablet Take one to two tablets at bedtime as needed for sleep. 60 tablet 2   lisinopril (ZESTRIL) 20 MG tablet Take 20 mg by mouth daily.     perphenazine (TRILAFON) 4 MG tablet Take one and 1/2 tablets at bedtime. 135 tablet 3   simvastatin (ZOCOR) 20 MG tablet Take 20 mg by mouth at bedtime.     traZODone (DESYREL) 150 MG tablet Take 1 tablet (150 mg total) by mouth at bedtime. 90 tablet 3   valACYclovir (VALTREX) 1000 MG tablet Take 1,000 mg by mouth daily.     valACYclovir (VALTREX) 500 MG tablet Take by mouth.     No current facility-administered medications for this visit.    Medication Side Effects: None  Allergies:  Allergies  Allergen Reactions   Other     Other reaction(s): hives   Penicillin G     Other reaction(s): Unknown   Penicillins Hives    whelps Other reaction(s): hives Other reaction(s): hives Other reaction(s): hives   Simvastatin     Other reaction(s): Myalgias    Past Medical History:  Diagnosis Date   Anemia    03/2011   Anxiety    no meds   Blood transfusion    2012 in [place]   Depression    no meds, bipolar no meds    Past Medical History, Surgical history, Social history, and Family history  were reviewed and updated as appropriate.   Please see review of systems for further details on the patient's review from today.   Objective:   Physical Exam:  LMP 08/09/2011   Physical Exam Constitutional:      General: She is not in acute distress. Musculoskeletal:        General: No deformity.  Neurological:     Mental Status: She is alert and oriented to person, place, and time.     Coordination: Coordination normal.  Psychiatric:        Attention and Perception: Attention and perception normal. She does not perceive auditory or visual hallucinations.        Mood and Affect: Mood normal. Mood is not anxious or depressed. Affect is not labile,  blunt, angry or inappropriate.        Speech: Speech normal.        Behavior: Behavior normal.        Thought Content: Thought content normal. Thought content is not paranoid or delusional. Thought content does not include homicidal or suicidal ideation. Thought content does not include homicidal or suicidal plan.        Cognition and Memory: Cognition and memory normal.        Judgment: Judgment normal.     Comments: Insight intact     Lab Review:     Component Value Date/Time   NA 139 06/30/2011 1440   K 4.4 06/30/2011 1440   CL 101 06/30/2011 1440   CO2 29 06/30/2011 1440   GLUCOSE 79 06/30/2011 1440   BUN 12 06/30/2011 1440   CREATININE 0.84 06/30/2011 1440   CALCIUM 10.4 06/30/2011 1440   PROT 7.6 06/30/2011 1440   ALBUMIN 3.8 06/30/2011 1440   AST 21 06/30/2011 1440   ALT 19 06/30/2011 1440   ALKPHOS 53 06/30/2011 1440   BILITOT 0.4 06/30/2011 1440   GFRNONAA >60 06/30/2011 1440   GFRAA >60 06/30/2011 1440       Component Value Date/Time   WBC 11.7 (H) 09/06/2011 0545   RBC 3.47 (L) 09/06/2011 0545   HGB 11.0 (L) 09/06/2011 0545   HGB 13.0 05/15/2011 0854   HCT 32.4 (L) 09/06/2011 0545   HCT 37.3 05/15/2011 0854   PLT 256 09/06/2011 0545   PLT 257 05/15/2011 0854   MCV 93.4 09/06/2011 0545   MCV 86 05/15/2011 0854   MCH 31.7 09/06/2011 0545   MCHC 34.0 09/06/2011 0545   RDW 11.8 09/06/2011 0545   RDW 15.1 05/15/2011 0854   LYMPHSABS 1.0 06/30/2011 1440   LYMPHSABS 1.1 05/15/2011 0854   MONOABS 0.7 06/30/2011 1440   EOSABS 0.1 06/30/2011 1440   EOSABS 0.1 05/15/2011 0854   BASOSABS 0.0 06/30/2011 1440   BASOSABS 0.0 05/15/2011 0854    No results found for: "POCLITH", "LITHIUM"   No results found for: "PHENYTOIN", "PHENOBARB", "VALPROATE", "CBMZ"   .res Assessment: Plan:    Plan:  1. Restart Trilafon 6mg  at hs  - discussed taper to get back to 6mg  2. Wellbutrin XL 300mg  every morning 3. Trazadone 150mg  at hs - taking 1/2 tablet at bedtime 4.  Clonazepam 0.5mg  - take one to two tablets at bedtime 5. Lamictal 25mg  daily x 2 weeks, then increase to 50mg  at hs.  Supplements:  MVI Magnesium Coq10 Fish oil Melatonin B12  Out of work currently - unable to work.  Sees Stevphen Meuse for therapy as needed.  RTC 4 weeks  Patient advised to contact office with any questions, adverse effects,  or acute worsening in signs and symptoms.  Discussed potential metabolic side effects associated with atypical antipsychotics, as well as potential risk for movement side effects. Advised pt to contact office if movement side effects occur.   Diagnoses and all orders for this visit:  Bipolar affective disorder in remission (HCC) -     perphenazine (TRILAFON) 4 MG tablet; Take one and 1/2 tablets at bedtime. -     lamoTRIgine (LAMICTAL) 25 MG tablet; Take one tablet at bedtime for 14 days, then increase to two tablets at bedtime.  Insomnia, unspecified type -     clonazePAM (KLONOPIN) 0.5 MG tablet; Take one to two tablets at bedtime as needed for sleep.  Generalized anxiety disorder  Panic attacks     Please see After Visit Summary for patient specific instructions.  Future Appointments  Date Time Provider Department Center  05/13/2023  9:40 AM Yazlynn Birkeland, Thereasa Solo, NP CP-CP None  05/23/2023  8:00 AM Stevphen Meuse, [facility] CP[facility] None    No orders of the defined types were placed in this encounter.   -------------------------------

## 2023-04-16 ENCOUNTER — Ambulatory Visit (INDEPENDENT_AMBULATORY_CARE_PROVIDER_SITE_OTHER): Payer: 59 | Admitting: Psychiatry

## 2023-04-16 DIAGNOSIS — F3170 Bipolar disorder, currently in remission, most recent episode unspecified: Secondary | ICD-10-CM | POA: Diagnosis not present

## 2023-04-16 NOTE — Progress Notes (Unsigned)
[facility] Counselor/Therapist Progress Note  Patient ID: Chloe Dennis, MRN: [id number],    Date: 04/16/2023  Time Spent: 52 minutes start time 11:00 AM end time 11:52 AM Virtual Visit via video Note Connected with patient by a telemedicine/telehealth application, with their informed consent, and verified patient privacy and that I am speaking with the correct person using two identifiers. I discussed the limitations, risks, security and privacy concerns of performing psychotherapy and the availability of in person appointments. I also discussed with the patient that there may be a patient responsible charge related to this service. The patient expressed understanding and agreed to proceed. I discussed the treatment planning with the patient. The patient was provided an opportunity to ask questions and all were answered. The patient agreed with the plan and demonstrated an understanding of the instructions. The patient was advised to call  our office if  symptoms worsen or feel they are in a crisis state and need immediate contact.   Therapist Location: home Patient Location: home    Treatment Type: Individual Therapy  Reported Symptoms: anxiety, rumination, focusing issues, memory issues, crying spell  Mental Status Exam:  Appearance:   Casual     Behavior:  Appropriate  Motor:  Normal  Speech/Language:   Normal Rate  Affect:  Appropriate  Mood:  anxious  Thought process:  normal  Thought content:    WNL  Sensory/Perceptual disturbances:    WNL  Orientation:  oriented to person, place, time/date, and situation  Attention:  Good  Concentration:  Good  Memory:  WNL  Fund of knowledge:   Good  Insight:    Good  Judgment:   Good  Impulse Control:  Good   Risk Assessment: Danger to Self:  No Self-injurious Behavior: No Danger to Others: No Duty to Warn:no Physical Aggression / Violence:No  Access to Firearms a concern: No  Gang Involvement:No   Subjective:  Patient was present for virtual session.She reported that it helped knowing that she wasn't stuck  She shared she is doing much better now that the medication is working. She reported she realized that she wasn't taking her medication correctly. She reported she is going back to work since she is doing better. Discussed coping skills to try as she prepares to go back to work.  Discussed importance of getting into her logical brain when she is triggered. She went on to share that her nephew was in a bad motorcycle accident and is in the hospital. He is alive but will have to go through a lot of rehab. She shared it is coming off of the death of her sister which was also hard for her family. She shared that she did listen to a podcast by Dr.Caroline Dennis. Discussed the importance of focusing on what she does right and making sure she gives that more energy than the negatives.   Interventions: Cognitive Behavioral Therapy  Diagnosis:   ICD-10-CM   1. Bipolar affective disorder in remission [facility])  F31.70       Plan:  Patient is to see provider Chloe Dennis DMHNP DNP on 04/05/2023 for medication assessment.  Patient is to remind herself that her brain is doing what it needs to do to keep herself at a good place.  She is to continue to walk to release negative emotions appropriately.  She is to spend time with her dogs pending them to help decrease negative emotions.   Long term goal:Establish an inward sense of self worth,  confidence and compotence   Chloe Dennis, [facility]

## 2023-05-03 ENCOUNTER — Ambulatory Visit (INDEPENDENT_AMBULATORY_CARE_PROVIDER_SITE_OTHER): Payer: 59 | Admitting: Adult Health

## 2023-05-03 ENCOUNTER — Encounter: Payer: Self-pay | Admitting: Adult Health

## 2023-05-03 DIAGNOSIS — F410 Panic disorder [episodic paroxysmal anxiety] without agoraphobia: Secondary | ICD-10-CM

## 2023-05-03 DIAGNOSIS — F3170 Bipolar disorder, currently in remission, most recent episode unspecified: Secondary | ICD-10-CM | POA: Diagnosis not present

## 2023-05-03 DIAGNOSIS — F411 Generalized anxiety disorder: Secondary | ICD-10-CM | POA: Diagnosis not present

## 2023-05-03 DIAGNOSIS — G4700 Insomnia, unspecified: Secondary | ICD-10-CM | POA: Diagnosis not present

## 2023-05-03 MED ORDER — LAMOTRIGINE 25 MG PO TABS
ORAL_TABLET | ORAL | 5 refills | Status: DC
Start: 2023-05-03 — End: 2023-08-05

## 2023-05-03 NOTE — Progress Notes (Signed)
Chloe Dennis [id number] 05-13-1961 62 y.o.  Subjective:   Patient ID:  Chloe Dennis is a 62 y.o. (DOB 1960-12-13) female.  Chief Complaint: No chief complaint on file.   HPI Chloe Dennis presents to the office today for follow-up of BPD, panic attacks, GAD, and insomnia.  Describes mood today as "ok". Pleasant. Mood symptoms - denies depression and anxiety. Reports some work related irritability. Denies panic attacks. Reports some worry, rumination, and over thinking. Mood is more consistent. Stating "I feel like I'm doing better". Feels like medications are helpful - taking consistently. Energy levels stable. Active, does not have a regular exercise routine.  Enjoys some usual interests and activities. Married. Lives with husband. Has a son from first marriage - 12 - lives in [place].   Appetite adequate. Weight stable 140 pounds. Reports sleeping better some nights than others.  Focus and concentration stable. Completing tasks. Managing aspects of household. Works part-time at [employer] x 4 years - 3 to 4 days a week Denies SI or HI.  Denies AH or VH. Denies self harm. Denies substance use.  Review of Systems:  Review of Systems  Musculoskeletal:  Negative for gait problem.  Neurological:  Negative for tremors.  Psychiatric/Behavioral:         Please refer to HPI    Medications: I have reviewed the patient's current medications.  Current Outpatient Medications  Medication Sig Dispense Refill   buPROPion (WELLBUTRIN XL) 300 MG 24 hr tablet Take 1 tablet (300 mg total) by mouth daily. 90 tablet 3   clonazePAM (KLONOPIN) 0.5 MG tablet Take one to two tablets at bedtime as needed for sleep. 60 tablet 2   lamoTRIgine (LAMICTAL) 25 MG tablet Take two tablets at bedtime. 60 tablet 5   lisinopril (ZESTRIL) 20 MG tablet Take 20 mg by mouth daily.     perphenazine (TRILAFON) 4 MG tablet Take one and 1/2 tablets at bedtime. 135 tablet 3   simvastatin  (ZOCOR) 20 MG tablet Take 20 mg by mouth at bedtime.     traZODone (DESYREL) 150 MG tablet Take 1 tablet (150 mg total) by mouth at bedtime. 90 tablet 3   valACYclovir (VALTREX) 1000 MG tablet Take 1,000 mg by mouth daily.     valACYclovir (VALTREX) 500 MG tablet Take by mouth.     No current facility-administered medications for this visit.    Medication Side Effects: None  Allergies:  Allergies  Allergen Reactions   Other     Other reaction(s): hives   Penicillin G     Other reaction(s): Unknown   Penicillins Hives    whelps Other reaction(s): hives Other reaction(s): hives Other reaction(s): hives   Simvastatin     Other reaction(s): Myalgias    Past Medical History:  Diagnosis Date   Anemia    03/2011   Anxiety    no meds   Blood transfusion    2012 in [place]   Depression    no meds, bipolar no meds    Past Medical History, Surgical history, Social history, and Family history were reviewed and updated as appropriate.   Please see review of systems for further details on the patient's review from today.   Objective:   Physical Exam:  LMP 08/09/2011   Physical Exam Constitutional:      General: She is not in acute distress. Musculoskeletal:        General: No deformity.  Neurological:     Mental Status: She is alert  and oriented to person, place, and time.     Coordination: Coordination normal.  Psychiatric:        Attention and Perception: Attention and perception normal. She does not perceive auditory or visual hallucinations.        Mood and Affect: Affect is not labile, blunt, angry or inappropriate.        Speech: Speech normal.        Behavior: Behavior normal.        Thought Content: Thought content normal. Thought content is not paranoid or delusional. Thought content does not include homicidal or suicidal ideation. Thought content does not include homicidal or suicidal plan.        Cognition and Memory: Cognition and memory normal.         Judgment: Judgment normal.     Comments: Insight intact     Lab Review:     Component Value Date/Time   NA 139 06/30/2011 1440   K 4.4 06/30/2011 1440   CL 101 06/30/2011 1440   CO2 29 06/30/2011 1440   GLUCOSE 79 06/30/2011 1440   BUN 12 06/30/2011 1440   CREATININE 0.84 06/30/2011 1440   CALCIUM 10.4 06/30/2011 1440   PROT 7.6 06/30/2011 1440   ALBUMIN 3.8 06/30/2011 1440   AST 21 06/30/2011 1440   ALT 19 06/30/2011 1440   ALKPHOS 53 06/30/2011 1440   BILITOT 0.4 06/30/2011 1440   GFRNONAA >60 06/30/2011 1440   GFRAA >60 06/30/2011 1440       Component Value Date/Time   WBC 11.7 (H) 09/06/2011 0545   RBC 3.47 (L) 09/06/2011 0545   HGB 11.0 (L) 09/06/2011 0545   HGB 13.0 05/15/2011 0854   HCT 32.4 (L) 09/06/2011 0545   HCT 37.3 05/15/2011 0854   PLT 256 09/06/2011 0545   PLT 257 05/15/2011 0854   MCV 93.4 09/06/2011 0545   MCV 86 05/15/2011 0854   MCH 31.7 09/06/2011 0545   MCHC 34.0 09/06/2011 0545   RDW 11.8 09/06/2011 0545   RDW 15.1 05/15/2011 0854   LYMPHSABS 1.0 06/30/2011 1440   LYMPHSABS 1.1 05/15/2011 0854   MONOABS 0.7 06/30/2011 1440   EOSABS 0.1 06/30/2011 1440   EOSABS 0.1 05/15/2011 0854   BASOSABS 0.0 06/30/2011 1440   BASOSABS 0.0 05/15/2011 0854    No results found for: "POCLITH", "LITHIUM"   No results found for: "PHENYTOIN", "PHENOBARB", "VALPROATE", "CBMZ"   .res Assessment: Plan:    Plan:  1. Restart Trilafon 6mg  at hs - taking 1/2 tablet daily 2. Wellbutrin XL 300mg  every morning 3. Trazadone 150mg  at hs - taking 1/2 tablet at bedtime 4. Clonazepam 0.5mg  - take one to two tablets at bedtime - usually takes one a day 5. Lamictal 50mg  at hs.  Supplements:  MVI Magnesium Fish oil Melatonin B12  Sees Stevphen Meuse for therapy as needed.  RTC 3 months  Patient advised to contact office with any questions, adverse effects, or acute worsening in signs and symptoms.  Discussed potential metabolic side effects associated with  atypical antipsychotics, as well as potential risk for movement side effects. Advised pt to contact office if movement side effects occur.   Discussed potential benefits, risk, and side effects of benzodiazepines to include potential risk of tolerance and dependence, as well as possible drowsiness.  Advised patient not to drive if experiencing drowsiness and to take lowest possible effective dose to minimize risk of dependence and tolerance.   Diagnoses and all orders for this visit:  Bipolar affective  disorder in remission (HCC) -     lamoTRIgine (LAMICTAL) 25 MG tablet; Take two tablets at bedtime.  Generalized anxiety disorder  Insomnia, unspecified type  Panic attacks     Please see After Visit Summary for patient specific instructions.  Future Appointments  Date Time Provider Department Center  05/23/2023  8:00 AM Stevphen Meuse, [facility] CP-CP None  08/05/2023 11:20 AM Caylah Plouff, Thereasa Solo, NP CP-CP None    No orders of the defined types were placed in this encounter.   -------------------------------

## 2023-05-13 ENCOUNTER — Ambulatory Visit: Payer: 59 | Admitting: Adult Health

## 2023-05-23 ENCOUNTER — Ambulatory Visit (INDEPENDENT_AMBULATORY_CARE_PROVIDER_SITE_OTHER): Payer: 59 | Admitting: Psychiatry

## 2023-05-23 DIAGNOSIS — F3170 Bipolar disorder, currently in remission, most recent episode unspecified: Secondary | ICD-10-CM

## 2023-05-23 NOTE — Progress Notes (Signed)
      [facility] Counselor/Therapist Progress Note  Patient ID: Chloe Dennis, MRN: [id number],    Date: 05/23/2023  Time Spent: 50 minutes start time 8:06 AM and time 8:56 AM  Treatment Type: Individual Therapy  Reported Symptoms: anxiety, impulsive behaviors, sadness, triggered responses  Mental Status Exam:  Appearance:   Well Groomed     Behavior:  Appropriate  Motor:  Normal  Speech/Language:   Normal Rate  Affect:  Appropriate  Mood:  anxious  Thought process:  normal  Thought content:    WNL  Sensory/Perceptual disturbances:    WNL  Orientation:  oriented to person, place, time/date, and situation  Attention:  Good  Concentration:  Good  Memory:  WNL  Fund of knowledge:   Good  Insight:    Good  Judgment:   Good  Impulse Control:  Fair   Risk Assessment: Danger to Self:  No Self-injurious Behavior: No Danger to Others: No Duty to Warn:no Physical Aggression / Violence:No  Access to Firearms a concern: No  Gang Involvement:No   Subjective: Patient was present for session.  She shared some relationship issues.  Had her think through perspective and trying to figure out what message she is sending her husband and what message she is wanting to send.  Developed treatment plan and goals with patient.  Through that process she was able to see that one of her issues is that she is very extroverted and her husband is introverted.  She is wanting to go and do things but fearful of doing them on her own.  Encouraged patient to start trying to walk her dog in the neighborhood and seeing if she can meet other neighbors through that process to start with.  Patient was also able to realize at this point she needs to talk to her husband about some of her concerns and share her treatment planning with him so that he can see she is moving things in a good direction.  Interventions: Solution-Oriented/Positive Psychology CBT  Diagnosis:   ICD-10-CM   1. Bipolar affective  disorder in remission [facility])  F31.70       Plan:  Patient is to practice CBT and coping skills to help decrease mood issues and increase self-esteem.  Patient is  to continue listening to podcast by Dr. De Burrs.  She is to continue to walk to release negative emotions appropriately.  She is to walk with her dog and see if she can meet other neighbors.  Patient is to work on her perspective as she makes decisions about relationships.   Long term goal:Establish an inward sense of self worth, confidence and compotence-identify negative self messages and replace them with affirmations Short-term goal: Decrease fear of rejection while increasing sense of self acceptance-develop coping skills to manage stressors and life  Stevphen Meuse, [facility]

## 2023-07-08 ENCOUNTER — Ambulatory Visit: Payer: 59 | Admitting: Psychiatry

## 2023-07-08 DIAGNOSIS — F3170 Bipolar disorder, currently in remission, most recent episode unspecified: Secondary | ICD-10-CM | POA: Diagnosis not present

## 2023-07-08 NOTE — Progress Notes (Unsigned)
[facility] Counselor/Therapist Progress Note  Patient ID: Chloe Dennis, MRN: [id number],    Date: 07/08/2023  Time Spent: 49 minutes start time 10:00 AM end time 10:49 AM Virtual Visit via Video Note Connected with patient by a telemedicine/telehealth application, with their informed consent, and verified patient privacy and that I am speaking with the correct person using two identifiers. I discussed the limitations, risks, security and privacy concerns of performing psychotherapy and the availability of in person appointments. I also discussed with the patient that there may be a patient responsible charge related to this service. The patient expressed understanding and agreed to proceed. I discussed the treatment planning with the patient. The patient was provided an opportunity to ask questions and all were answered. The patient agreed with the plan and demonstrated an understanding of the instructions. The patient was advised to call  our office if  symptoms worsen or feel they are in a crisis state and need immediate contact.   Therapist Location: home Patient Location: home    Treatment Type: Individual Therapy  Reported Symptoms: anxiety, sleep issues  Mental Status Exam:  Appearance:   Casual     Behavior:  Appropriate  Motor:  Normal  Speech/Language:   Normal Rate  Affect:  Appropriate  Mood:  normal  Thought process:  normal  Thought content:    WNL  Sensory/Perceptual disturbances:    WNL  Orientation:  oriented to person, place, time/date, and situation  Attention:  Good  Concentration:  Good  Memory:  WNL  Fund of knowledge:   Good  Insight:    Good  Judgment:   Good  Impulse Control:  Good   Risk Assessment: Danger to Self:  No Self-injurious Behavior: No Danger to Others: No Duty to Warn:no Physical Aggression / Violence:No  Access to Firearms a concern: No  Gang Involvement:No   Subjective: Met with patient via virtual session. She  shared that things are going better with her husband. She is talking to [place] again with her husband's permission. Encouraged her to feel good about talking with her husband about talking to him. Discussed trying to find more ways to connect with others so she can get her social need met.  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 Discussed other potential places that may help her have different experiences. She was encouraged to feel good about the progress she is making and to continue communicating with her husband and trying to find things that she can do to keep her interacting with others in positive manners.  Interventions: Cognitive Behavioral Therapy and Solution-Oriented/Positive Psychology  Diagnosis:   ICD-10-CM   1. Bipolar affective disorder in remission [facility])  F31.70       Plan: Patient is to practice CBT and coping skills to help decrease mood issues and increase self-esteem.  Patient is  to continue listening to podcast by Dr. De Burrs.  She is to continue to walk to release negative emotions appropriately.  She is to walk with her dog and see if she can meet other neighbors.  Patient is to work on her perspective as she makes decisions about relationships.   Long term goal:Establish an inward sense of self worth, confidence and compotence-identify negative self messages and replace them with affirmations Short-term goal: Decrease fear of rejection while increasing sense of self acceptance-develop coping skills to manage stressors and life    Stevphen Meuse, [facility]

## 2023-08-05 ENCOUNTER — Encounter: Payer: Self-pay | Admitting: Adult Health

## 2023-08-05 ENCOUNTER — Ambulatory Visit (INDEPENDENT_AMBULATORY_CARE_PROVIDER_SITE_OTHER): Payer: 59 | Admitting: Adult Health

## 2023-08-05 DIAGNOSIS — F410 Panic disorder [episodic paroxysmal anxiety] without agoraphobia: Secondary | ICD-10-CM | POA: Diagnosis not present

## 2023-08-05 DIAGNOSIS — F3170 Bipolar disorder, currently in remission, most recent episode unspecified: Secondary | ICD-10-CM

## 2023-08-05 DIAGNOSIS — F411 Generalized anxiety disorder: Secondary | ICD-10-CM

## 2023-08-05 DIAGNOSIS — G4700 Insomnia, unspecified: Secondary | ICD-10-CM

## 2023-08-05 MED ORDER — PERPHENAZINE 4 MG PO TABS
ORAL_TABLET | ORAL | 3 refills | Status: DC
Start: 2023-08-05 — End: 2024-06-19

## 2023-08-05 MED ORDER — BUPROPION HCL ER (XL) 300 MG PO TB24
300.0000 mg | ORAL_TABLET | Freq: Every day | ORAL | 3 refills | Status: DC
Start: 2023-08-05 — End: 2024-09-14

## 2023-08-05 MED ORDER — CLONAZEPAM 0.5 MG PO TABS: ORAL_TABLET | ORAL | 2 refills | Status: DC

## 2023-08-05 MED ORDER — LAMOTRIGINE 25 MG PO TABS
ORAL_TABLET | ORAL | 5 refills | Status: DC
Start: 2023-08-05 — End: 2023-10-28

## 2023-08-05 MED ORDER — TRAZODONE HCL 150 MG PO TABS
150.0000 mg | ORAL_TABLET | Freq: Every day | ORAL | 3 refills | Status: DC
Start: 2023-08-05 — End: 2024-05-12

## 2023-08-05 NOTE — Progress Notes (Signed)
Chloe Dennis [id number] 1961-06-12 62 y.o.  Subjective:   Patient ID:  Chloe Dennis is a 62 y.o. (DOB 1961-05-11) female.  Chief Complaint: No chief complaint on file.   HPI Chloe Dennis presents to the office today for follow-up of BPD, panic attacks, GAD, and insomnia.  Describes mood today as "ok". Pleasant. Mood symptoms - denies depression, anxiety, and irritability.  Denies panic attacks. Reports some worry, rumination, and over thinking. Mood is stable Stating "I feel like I'm doing alright". Feels like medications are helpful - taking consistently. Energy levels stable. Active, does not have a regular exercise routine.  Enjoys some usual interests and activities. Married. Lives with husband. Has a son from first marriage - lives in [place].   Appetite adequate. Weight gain 140 to 145 pounds. Reports sleeping better some nights than others. Averages 6 or more  hours. Focus and concentration stable. Completing tasks. Managing aspects of household. Works part-time at [employer] x 4 years - 3 to 4 days a week Denies SI or HI.  Denies AH or VH. Denies self harm. Denies substance use.   Review of Systems:  Review of Systems  Musculoskeletal:  Negative for gait problem.  Neurological:  Negative for tremors.  Psychiatric/Behavioral:         Please refer to HPI    Medications: I have reviewed the patient's current medications.  Current Outpatient Medications  Medication Sig Dispense Refill   buPROPion (WELLBUTRIN XL) 300 MG 24 hr tablet Take 1 tablet (300 mg total) by mouth daily. 90 tablet 3   clonazePAM (KLONOPIN) 0.5 MG tablet Take one to two tablets at bedtime as needed for sleep. 60 tablet 2   lamoTRIgine (LAMICTAL) 25 MG tablet Take two tablets at bedtime. 60 tablet 5   lisinopril (ZESTRIL) 20 MG tablet Take 20 mg by mouth daily.     perphenazine (TRILAFON) 4 MG tablet Take one and 1/2 tablets at bedtime. 135 tablet 3   simvastatin (ZOCOR) 20  MG tablet Take 20 mg by mouth at bedtime.     traZODone (DESYREL) 150 MG tablet Take 1 tablet (150 mg total) by mouth at bedtime. 90 tablet 3   valACYclovir (VALTREX) 1000 MG tablet Take 1,000 mg by mouth daily.     valACYclovir (VALTREX) 500 MG tablet Take by mouth.     No current facility-administered medications for this visit.    Medication Side Effects: None  Allergies:  Allergies  Allergen Reactions   Other     Other reaction(s): hives   Penicillin G     Other reaction(s): Unknown   Penicillins Hives    whelps Other reaction(s): hives Other reaction(s): hives Other reaction(s): hives   Simvastatin     Other reaction(s): Myalgias    Past Medical History:  Diagnosis Date   Anemia    03/2011   Anxiety    no meds   Blood transfusion    2012 in [place]   Depression    no meds, bipolar no meds    Past Medical History, Surgical history, Social history, and Family history were reviewed and updated as appropriate.   Please see review of systems for further details on the patient's review from today.   Objective:   Physical Exam:  LMP 08/09/2011   Physical Exam Constitutional:      General: She is not in acute distress. Musculoskeletal:        General: No deformity.  Neurological:     Mental Status: She  is alert and oriented to person, place, and time.     Coordination: Coordination normal.  Psychiatric:        Attention and Perception: Attention and perception normal. She does not perceive auditory or visual hallucinations.        Mood and Affect: Affect is not labile, blunt, angry or inappropriate.        Speech: Speech normal.        Behavior: Behavior normal.        Thought Content: Thought content normal. Thought content is not paranoid or delusional. Thought content does not include homicidal or suicidal ideation. Thought content does not include homicidal or suicidal plan.        Cognition and Memory: Cognition and memory normal.        Judgment:  Judgment normal.     Comments: Insight intact     Lab Review:     Component Value Date/Time   NA 139 06/30/2011 1440   K 4.4 06/30/2011 1440   CL 101 06/30/2011 1440   CO2 29 06/30/2011 1440   GLUCOSE 79 06/30/2011 1440   BUN 12 06/30/2011 1440   CREATININE 0.84 06/30/2011 1440   CALCIUM 10.4 06/30/2011 1440   PROT 7.6 06/30/2011 1440   ALBUMIN 3.8 06/30/2011 1440   AST 21 06/30/2011 1440   ALT 19 06/30/2011 1440   ALKPHOS 53 06/30/2011 1440   BILITOT 0.4 06/30/2011 1440   GFRNONAA >60 06/30/2011 1440   GFRAA >60 06/30/2011 1440       Component Value Date/Time   WBC 11.7 (H) 09/06/2011 0545   RBC 3.47 (L) 09/06/2011 0545   HGB 11.0 (L) 09/06/2011 0545   HGB 13.0 05/15/2011 0854   HCT 32.4 (L) 09/06/2011 0545   HCT 37.3 05/15/2011 0854   PLT 256 09/06/2011 0545   PLT 257 05/15/2011 0854   MCV 93.4 09/06/2011 0545   MCV 86 05/15/2011 0854   MCH 31.7 09/06/2011 0545   MCHC 34.0 09/06/2011 0545   RDW 11.8 09/06/2011 0545   RDW 15.1 05/15/2011 0854   LYMPHSABS 1.0 06/30/2011 1440   LYMPHSABS 1.1 05/15/2011 0854   MONOABS 0.7 06/30/2011 1440   EOSABS 0.1 06/30/2011 1440   EOSABS 0.1 05/15/2011 0854   BASOSABS 0.0 06/30/2011 1440   BASOSABS 0.0 05/15/2011 0854    No results found for: "POCLITH", "LITHIUM"   No results found for: "PHENYTOIN", "PHENOBARB", "VALPROATE", "CBMZ"   .res Assessment: Plan:    Plan:  1. Trilafon 6mg  at hs - taking one and 1/2 tablet daily 2. Wellbutrin XL 300mg  every morning 3. Trazadone 150mg  at hs - taking 1/2 tablet at bedtime 4. Lamictal 50mg  at hs. 5. Increase Clonazepam 0.5mg  - from two tablets at bedtime to one tablet in the morning and 2 tablets at bedtime.   Supplements:  MVI Magnesium Fish oil Melatonin B12  Sees Stevphen Meuse for therapy as needed.  RTC 6 months  Patient advised to contact office with any questions, adverse effects, or acute worsening in signs and symptoms.  Discussed potential metabolic side  effects associated with atypical antipsychotics, as well as potential risk for movement side effects. Advised pt to contact office if movement side effects occur.   Discussed potential benefits, risk, and side effects of benzodiazepines to include potential risk of tolerance and dependence, as well as possible drowsiness.  Advised patient not to drive if experiencing drowsiness and to take lowest possible effective dose to minimize risk of dependence and tolerance.   There are  no diagnoses linked to this encounter.   Please see After Visit Summary for patient specific instructions.  Future Appointments  Date Time Provider Department Center  09/02/2023 12:00 PM Stevphen Meuse, [facility] CP-CP None    No orders of the defined types were placed in this encounter.   -------------------------------

## 2023-08-16 ENCOUNTER — Other Ambulatory Visit: Payer: Self-pay

## 2023-08-16 ENCOUNTER — Telehealth: Payer: Self-pay

## 2023-08-16 NOTE — Telephone Encounter (Signed)
Patient seen 10/14.  1. Trilafon 6mg  at hs - taking one and 1/2 tablet daily 2. Wellbutrin XL 300mg  every morning 3. Trazadone 150mg  at hs - taking 1/2 tablet at bedtime 4. Lamictal 50mg  at hs. 5. Increase Clonazepam 0.5mg  - from two tablets at bedtime to one tablet in the morning and 2 tablets at bedtime.   She is crying, cries a lot. Stressful situations at home and work. She said she has resolved the issue at home and thinks she has resolved the issue at work, but work on Sunday and just falls apart. I asked if any SI and she said "I'm trying not to." I tried to review emergency resources with her and she said she didn't want to go to the hospital. I told her that I just needed to provide information in case she thought she may need it. She said she just wants an increase in her AD. I told her that it would not be an overnight fix and she said as long as she knew she was getting something she thought she would be ok.

## 2023-08-16 NOTE — Telephone Encounter (Signed)
Per discussion with Almira Coaster, patient was told to increase Lamictal from 50 mg to 100 mg. Notified patient of recommendation.

## 2023-08-19 ENCOUNTER — Other Ambulatory Visit: Payer: Self-pay | Admitting: Endocrinology

## 2023-08-19 ENCOUNTER — Encounter: Payer: Self-pay | Admitting: Endocrinology

## 2023-08-19 DIAGNOSIS — M8580 Other specified disorders of bone density and structure, unspecified site: Secondary | ICD-10-CM

## 2023-08-19 DIAGNOSIS — E041 Nontoxic single thyroid nodule: Secondary | ICD-10-CM

## 2023-08-23 ENCOUNTER — Other Ambulatory Visit: Payer: Self-pay | Admitting: Adult Health

## 2023-08-23 DIAGNOSIS — G4700 Insomnia, unspecified: Secondary | ICD-10-CM

## 2023-08-28 ENCOUNTER — Telehealth: Payer: Self-pay | Admitting: Adult Health

## 2023-08-28 MED ORDER — LITHIUM CARBONATE 300 MG PO TABS: 300.0000 mg | ORAL_TABLET | Freq: Every day | ORAL | 1 refills | Status: DC

## 2023-08-28 NOTE — Telephone Encounter (Signed)
Patient was increased from 50 to 100 mg of Lamictal on 10/25. She is reporting more mania - excessive talking, hyper, not sleeping much; and trouble concentrating. 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 She has clonazepam for sleep, but said she could take one in the AM for anxiety and she reports that it isn't working well for her anxiety.

## 2023-08-28 NOTE — Telephone Encounter (Signed)
Patient notified. Canceled Lithium Rx.

## 2023-08-28 NOTE — Telephone Encounter (Signed)
Next visit is 10/28/23. Chloe Dennis called and says her Lamictal might be too much for her. She is more manic and can't concentrate. Could she be called back at [phone]. Pharmacy is:  [id number] [place], [place] - [address]   Phone: [phone]  Fax: [phone]

## 2023-08-28 NOTE — Telephone Encounter (Signed)
Patient said she has CKD and HTN and shouldn't be on lithium. She wonders if the sleep issue is from Lamictal.

## 2023-09-02 ENCOUNTER — Ambulatory Visit (INDEPENDENT_AMBULATORY_CARE_PROVIDER_SITE_OTHER): Payer: 59 | Admitting: Psychiatry

## 2023-09-02 DIAGNOSIS — F3170 Bipolar disorder, currently in remission, most recent episode unspecified: Secondary | ICD-10-CM

## 2023-09-02 NOTE — Progress Notes (Unsigned)
[facility] Counselor/Therapist Progress Note  Patient ID: Chloe Dennis, MRN: [id number],    Date: 09/02/2023  Time Spent: 50 minutes start time 11:51 AM end time 1241 Virtual Visit via Video Note Connected with patient by a telemedicine/telehealth application, with their informed consent, and verified patient privacy and that I am speaking with the correct person using two identifiers. I discussed the limitations, risks, security and privacy concerns of performing psychotherapy and the availability of in person appointments. I also discussed with the patient that there may be a patient responsible charge related to this service. The patient expressed understanding and agreed to proceed. I discussed the treatment planning with the patient. The patient was provided an opportunity to ask questions and all were answered. The patient agreed with the plan and demonstrated an understanding of the instructions. The patient was advised to call  our office if  symptoms worsen or feel they are in a crisis state and need immediate contact.   Therapist Location: home Patient Location: home    Treatment Type: Individual Therapy  Reported Symptoms: sadness, anxiety, triggered responses, rumination, stomach issues, intrusive thoughts, sleep issues, fatigue, crying spells  Mental Status Exam:  Appearance:   Casual     Behavior:  Appropriate  Motor:  Normal  Speech/Language:   Normal Rate  Affect:  Appropriate  Mood:  anxious  Thought process:  normal  Thought content:    WNL  Sensory/Perceptual disturbances:    WNL  Orientation:  oriented to person, place, time/date, and situation  Attention:  Good  Concentration:  Good  Memory:  WNL  Fund of knowledge:   Good  Insight:    Good  Judgment:   Good  Impulse Control:  Good   Risk Assessment: Danger to Self:  No Self-injurious Behavior: No Danger to Others: No Duty to Warn:no Physical Aggression / Violence:No  Access to Firearms  a concern: No  Gang Involvement:No   Subjective: Met with patient via virtual session. She shared that work was very stressful and they are cutting back on hours and her boss was being very critical of her in front of coworkers. She was able to confront her which was good. She went on to share [place] started trying to start things with her again so she had to end things with him completely. She also started having lots of stomach issues due to the anxiety. She shared that she is realizing that she cares too much about others and it negatively impacts her. Her husband did tell her she can leave her job if she needs to. She went on to share that her husband was stressed at work and he was bringing it home and taking it out on her.  Patient shared she was able to recognize it and talk to him about it and he did apologize which was a very positive thing.  Discussed the importance of getting the stress out of her in a positive manner.  The importance of doing things that she enjoys and connecting with others was discussed with patient.  Patient was able to recognize that when she connects with others she feels more positive even though it may be very difficult for her.  She also was able to realize when she has positive things to look forward to it keeps her moving in a more positive direction.  Patient was able to share that she and her husband are going to her sons to see her grandchild at Thanksgiving and that  is something she can really get excited about and start preparing for.  The importance of recognizing that thoughts lead to feelings lead to behaviors was discussed with patient and that she has to work on what she is thinking and stay focused on the facts rather than the rumination.  Interventions: Cognitive Behavioral Therapy and Solution-Oriented/Positive Psychology  Diagnosis:   ICD-10-CM   1. Bipolar affective disorder in remission [facility])  F31.70       Plan:  Patient is to practice CBT and  coping skills to help decrease mood issues and increase self-esteem.  Patient is  to continue listening to podcast by Dr. De Burrs.  She is to continue to walk to release negative emotions appropriately.  She is to walk with her dog and see if she can meet other neighbors.  Patient is to work on her perspective as she makes decisions about relationships.   Long term goal:Establish an inward sense of self worth, confidence and compotence-identify negative self messages and replace them with affirmations Short-term goal: Decrease fear of rejection while increasing sense of self acceptance-develop coping skills to manage stressors and life  Stevphen Meuse, [facility]

## 2023-10-28 ENCOUNTER — Telehealth: Payer: 59 | Admitting: Adult Health

## 2023-10-28 ENCOUNTER — Encounter: Payer: Self-pay | Admitting: Adult Health

## 2023-10-28 DIAGNOSIS — F3170 Bipolar disorder, currently in remission, most recent episode unspecified: Secondary | ICD-10-CM

## 2023-10-28 DIAGNOSIS — F410 Panic disorder [episodic paroxysmal anxiety] without agoraphobia: Secondary | ICD-10-CM

## 2023-10-28 DIAGNOSIS — F319 Bipolar disorder, unspecified: Secondary | ICD-10-CM

## 2023-10-28 DIAGNOSIS — G4700 Insomnia, unspecified: Secondary | ICD-10-CM

## 2023-10-28 DIAGNOSIS — F411 Generalized anxiety disorder: Secondary | ICD-10-CM

## 2023-10-28 MED ORDER — LAMOTRIGINE 25 MG PO TABS: ORAL_TABLET | ORAL | 5 refills | Status: DC

## 2023-10-28 NOTE — Progress Notes (Signed)
 Chloe Dennis [id number] 31-Jan-1961 63 y.o.  Virtual Visit via Video Note  I connected with pt @ on 10/28/23 at  9:00 AM EST by a video enabled telemedicine application and verified that I am speaking with the correct person using two identifiers.   I discussed the limitations of evaluation and management by telemedicine and the availability of in person appointments. The patient expressed understanding and agreed to proceed.  I discussed the assessment and treatment plan with the patient. The patient was provided an opportunity to ask questions and all were answered. The patient agreed with the plan and demonstrated an understanding of the instructions.   The patient was advised to call back or seek an in-person evaluation if the symptoms worsen or if the condition fails to improve as anticipated.  I provided 25 minutes of non-face-to-face time during this encounter.  The patient was located at home.  The provider was located at [facility].   Angeline LOISE Sayers, NP   Subjective:   Patient ID:  Chloe Dennis is a 63 y.o. (DOB 06-24-1961) female.  Chief Complaint: No chief complaint on file.   HPI Chloe Dennis presents for follow-up of BPD, panic attacks, GAD, and insomnia.  Describes mood today as ok. Pleasant. Mood symptoms - denies depression, anxiety, and irritability. Denies panic attacks. Reports some worry, rumination, and over thinking - nothing out of control. Mood is stable.  Stating I feel like I'm right where I need to be. Feels like medications are helpful - taking consistently. Energy levels stable. Active, does not have a regular exercise routine.  Enjoys some usual interests and activities. Married. Lives with husband. Has a son from first marriage - lives in [place] .   Appetite adequate. Weight 145 pounds. Reports sleeping better some nights than others. Averages 6 or more hours. Focus and concentration stable. Completing tasks.  Managing aspects of household. Works part-time at [employer] . Denies SI or HI.  Denies AH or VH. Denies self harm. Denies substance use.   Review of Systems:  Review of Systems  Musculoskeletal:  Negative for gait problem.  Neurological:  Negative for tremors.  Psychiatric/Behavioral:         Please refer to HPI    Medications: I have reviewed the patient's current medications.  Current Outpatient Medications  Medication Sig Dispense Refill   buPROPion  (WELLBUTRIN  XL) 300 MG 24 hr tablet Take 1 tablet (300 mg total) by mouth daily. 90 tablet 3   clonazePAM  (KLONOPIN ) 0.5 MG tablet TAKE ONE TO TWO TABLETS AT BEDTIME AS NEEDED FOR SLEEP. 60 tablet 2   lamoTRIgine  (LAMICTAL ) 25 MG tablet Take two tablets at bedtime. 60 tablet 5   lisinopril (ZESTRIL) 20 MG tablet Take 20 mg by mouth daily.     lithium  300 MG tablet Take 1 tablet (300 mg total) by mouth at bedtime. 30 tablet 1   perphenazine  (TRILAFON ) 4 MG tablet Take one and 1/2 tablets at bedtime. 135 tablet 3   simvastatin (ZOCOR) 20 MG tablet Take 20 mg by mouth at bedtime.     traZODone  (DESYREL ) 150 MG tablet Take 1 tablet (150 mg total) by mouth at bedtime. 90 tablet 3   valACYclovir (VALTREX) 1000 MG tablet Take 1,000 mg by mouth daily.     valACYclovir (VALTREX) 500 MG tablet Take by mouth.     No current facility-administered medications for this visit.    Medication Side Effects: None  Allergies:  Allergies  Allergen Reactions   Other  Other reaction(s): hives   Penicillin G     Other reaction(s): Unknown   Penicillins Hives    whelps Other reaction(s): hives Other reaction(s): hives Other reaction(s): hives   Simvastatin     Other reaction(s): Myalgias    Past Medical History:  Diagnosis Date   Anemia    03/2011   Anxiety    no meds   Blood transfusion    2012 in [place]   Depression    no meds, bipolar no meds    Family History  Problem Relation Age of Onset   Breast cancer Sister  30   Schizophrenia Sister     Social History   Socioeconomic History   Marital status: Married    Spouse name: Not on file   Number of children: Not on file   Years of education: Not on file   Highest education level: Not on file  Occupational History   Not on file  Tobacco Use   Smoking status: Never   Smokeless tobacco: Never  Vaping Use   Vaping status: Never Used  Substance and Sexual Activity   Alcohol use: No   Drug use: No   Sexual activity: Yes    Partners: Male    Birth control/protection: None    Comment: married  Other Topics Concern   Not on file  Social History Narrative   Not on file   Social Drivers of Health   Financial Resource Strain: Not on file  Food Insecurity: Not on file  Transportation Needs: Not on file  Physical Activity: Not on file  Stress: Not on file  Social Connections: Not on file  Intimate Partner Violence: Not on file    Past Medical History, Surgical history, Social history, and Family history were reviewed and updated as appropriate.   Please see review of systems for further details on the patient's review from today.   Objective:   Physical Exam:  LMP 08/09/2011   Physical Exam Constitutional:      General: She is not in acute distress. Musculoskeletal:        General: No deformity.  Neurological:     Mental Status: She is alert and oriented to person, place, and time.     Coordination: Coordination normal.  Psychiatric:        Attention and Perception: Attention and perception normal. She does not perceive auditory or visual hallucinations.        Mood and Affect: Affect is not labile, blunt, angry or inappropriate.        Speech: Speech normal.        Behavior: Behavior normal.        Thought Content: Thought content normal. Thought content is not paranoid or delusional. Thought content does not include homicidal or suicidal ideation. Thought content does not include homicidal or suicidal plan.        Cognition  and Memory: Cognition and memory normal.        Judgment: Judgment normal.     Comments: Insight intact     Lab Review:     Component Value Date/Time   NA 139 06/30/2011 1440   K 4.4 06/30/2011 1440   CL 101 06/30/2011 1440   CO2 29 06/30/2011 1440   GLUCOSE 79 06/30/2011 1440   BUN 12 06/30/2011 1440   CREATININE 0.84 06/30/2011 1440   CALCIUM 10.4 06/30/2011 1440   PROT 7.6 06/30/2011 1440   ALBUMIN 3.8 06/30/2011 1440   AST 21 06/30/2011 1440  ALT 19 06/30/2011 1440   ALKPHOS 53 06/30/2011 1440   BILITOT 0.4 06/30/2011 1440   GFRNONAA >60 06/30/2011 1440   GFRAA >60 06/30/2011 1440       Component Value Date/Time   WBC 11.7 (H) 09/06/2011 0545   RBC 3.47 (L) 09/06/2011 0545   HGB 11.0 (L) 09/06/2011 0545   HGB 13.0 05/15/2011 0854   HCT 32.4 (L) 09/06/2011 0545   HCT 37.3 05/15/2011 0854   PLT 256 09/06/2011 0545   PLT 257 05/15/2011 0854   MCV 93.4 09/06/2011 0545   MCV 86 05/15/2011 0854   MCH 31.7 09/06/2011 0545   MCHC 34.0 09/06/2011 0545   RDW 11.8 09/06/2011 0545   RDW 15.1 05/15/2011 0854   LYMPHSABS 1.0 06/30/2011 1440   LYMPHSABS 1.1 05/15/2011 0854   MONOABS 0.7 06/30/2011 1440   EOSABS 0.1 06/30/2011 1440   EOSABS 0.1 05/15/2011 0854   BASOSABS 0.0 06/30/2011 1440   BASOSABS 0.0 05/15/2011 0854    No results found for: POCLITH, LITHIUM    No results found for: PHENYTOIN, PHENOBARB, VALPROATE, CBMZ   .res Assessment: Plan:    Plan:  Trilafon  6mg  at hs - taking one and 1/2 tablet daily of the 4mg   Lamictal  50mg  at hs. Wellbutrin  XL 300mg  every morning Trazadone 150mg  at hs - taking 1/2 tablet at bedtime Increase Clonazepam  0.5mg  - 2 tablets at bedtime.   Supplements:  MVI Magnesium Fish oil Melatonin B12  Sees Holly Ingram for therapy as needed.  RTC 3 months  Patient advised to contact office with any questions, adverse effects, or acute worsening in signs and symptoms.  Discussed potential metabolic side  effects associated with atypical antipsychotics, as well as potential risk for movement side effects. Advised pt to contact office if movement side effects occur.   Discussed potential benefits, risk, and side effects of benzodiazepines to include potential risk of tolerance and dependence, as well as possible drowsiness.  Advised patient not to drive if experiencing drowsiness and to take lowest possible effective dose to minimize risk of dependence and tolerance.  There are no diagnoses linked to this encounter.   Please see After Visit Summary for patient specific instructions.  Future Appointments  Date Time Provider Department Center  10/28/2023  9:00 AM Onesti Bonfiglio Nattalie, NP CP-CP None  10/29/2023 11:00 AM Gail Castilla, [facility] CP-CP None  05/14/2024 12:00 PM [facility] DX DEXA 1 [facility]    No orders of the defined types were placed in this encounter.     -------------------------------

## 2023-10-29 ENCOUNTER — Ambulatory Visit: Payer: 59 | Admitting: Psychiatry

## 2023-10-29 DIAGNOSIS — F3170 Bipolar disorder, currently in remission, most recent episode unspecified: Secondary | ICD-10-CM

## 2023-10-29 NOTE — Progress Notes (Signed)
 [facility] Counselor/Therapist Progress Note  Patient ID: Chloe Dennis, MRN: [id number],    Date: 10/29/2023  Time Spent: 50 minutes start time 10:56 AM end time 11:46 AM Virtual Visit via Video Note Connected with patient by a telemedicine/telehealth application, with their informed consent, and verified patient privacy and that I am speaking with the correct person using two identifiers. I discussed the limitations, risks, security and privacy concerns of performing psychotherapy and the availability of in person appointments. I also discussed with the patient that there may be a patient responsible charge related to this service. The patient expressed understanding and agreed to proceed. I discussed the treatment planning with the patient. The patient was provided an opportunity to ask questions and all were answered. The patient agreed with the plan and demonstrated an understanding of the instructions. The patient was advised to call  our office if  symptoms worsen or feel they are in a crisis state and need immediate contact.   Therapist Location: home Patient Location: home    Treatment Type: Individual Therapy  Reported Symptoms: anxiety, triggered responses, impulsive behaviors, sadness, rumination  Mental Status Exam:  Appearance:   Well Groomed     Behavior:  Appropriate  Motor:  Normal  Speech/Language:   Normal Rate  Affect:  Appropriate  Mood:  normal  Thought process:  normal  Thought content:    WNL  Sensory/Perceptual disturbances:    WNL  Orientation:  oriented to person, place, time/date, and situation  Attention:  Good  Concentration:  Good  Memory:  WNL  Fund of knowledge:   Good  Insight:    Good  Judgment:   Good  Impulse Control:  Good   Risk Assessment: Danger to Self:  No Self-injurious Behavior: No Danger to Others: No Duty to Warn:no Physical Aggression / Violence:No  Access to Firearms a concern: No  Gang Involvement:No    Subjective: Met with patient via virtual session. She shared that she made contact with Ruth again and it did not go well. She explained what happened and what she learned about the whole situation. She shared that she is not going to have contact with him ever again. Had her think  through what lead her to decision with [place]. She was able to recognize she needs more social contact because loneliness is what led to the behavior. She went on to share that she has too much time with only working a few days a week. She explained she is in the process of trying to start volunteering at the [employer].  Discussed other possible options for her time including joining a gym or considering another work place. She admitted that she needs to have more things to keep her brain engaged in positive and social activity.  Had patient think through some other activities to also keep her brain engaged in a more positive behavior so she does not have more impulsive episodes.  Also discussed how her goals are wanting to work on interpersonal relationships and to do that she needs to develop more relationships in general.  Interventions: Solution-Oriented/Positive Psychology  Diagnosis:   ICD-10-CM   1. Bipolar affective disorder in remission [facility])  F31.70       Plan:  Patient is to practice CBT and coping skills to help decrease mood issues and increase self-esteem.  Patient is to work on finding opportunities to have more social contact including talking to her husband about going to the gym, looking into  churches, and museum/gallery curator services.  Patient is to keep her brain engaged in positive activity doing puzzles or working on different projects around the house.  Patient is  to continue listening to podcast by Dr. Aleck Favorite.  She is to continue to walk to release negative emotions appropriately.  She is to walk with her dog and see if she can meet other neighbors.  Patient is to work on her  perspective as she makes decisions about relationships.    Silvano Pacini, [facility]

## 2024-01-06 ENCOUNTER — Encounter: Payer: Self-pay | Admitting: Psychiatry

## 2024-01-06 ENCOUNTER — Other Ambulatory Visit: Payer: Self-pay

## 2024-01-06 ENCOUNTER — Telehealth: Payer: Self-pay | Admitting: Adult Health

## 2024-01-06 ENCOUNTER — Ambulatory Visit: Payer: 59 | Admitting: Psychiatry

## 2024-01-06 DIAGNOSIS — G4700 Insomnia, unspecified: Secondary | ICD-10-CM

## 2024-01-06 DIAGNOSIS — F3170 Bipolar disorder, currently in remission, most recent episode unspecified: Secondary | ICD-10-CM

## 2024-01-06 MED ORDER — CLONAZEPAM 0.5 MG PO TABS: ORAL_TABLET | ORAL | 0 refills | Status: DC

## 2024-01-06 NOTE — Telephone Encounter (Signed)
 Pt called 9:12 am requesting Rx for Clonazepam to [facility] [address]

## 2024-01-06 NOTE — Progress Notes (Signed)
 [facility] Counselor/Therapist Progress Note  Patient ID: Chloe Dennis, MRN: [id number],    Date: 01/06/2024  Time Spent: 43 minutes start time 11:04 AM end time 11:47 AM Virtual Visit via Video Note Connected with patient by a telemedicine/telehealth application, with their informed consent, and verified patient privacy and that I am speaking with the correct person using two identifiers. I discussed the limitations, risks, security and privacy concerns of performing psychotherapy and the availability of in person appointments. I also discussed with the patient that there may be a patient responsible charge related to this service. The patient expressed understanding and agreed to proceed. I discussed the treatment planning with the patient. The patient was provided an opportunity to ask questions and all were answered. The patient agreed with the plan and demonstrated an understanding of the instructions. The patient was advised to call  our office if  symptoms worsen or feel they are in a crisis state and need immediate contact.   Therapist Location: home Patient Location: home    Treatment Type: Individual Therapy  Reported Symptoms: anxiety  Mental Status Exam:  Appearance:   Well Groomed     Behavior:  Appropriate  Motor:  Normal  Speech/Language:   Normal Rate  Affect:  Appropriate  Mood:  normal  Thought process:  normal  Thought content:    WNL  Sensory/Perceptual disturbances:    WNL  Orientation:  oriented to person, place, time/date, and situation  Attention:  Good  Concentration:  Good  Memory:  WNL  Fund of knowledge:   Good  Insight:    Good  Judgment:   Good  Impulse Control:  Good   Risk Assessment: Danger to Self:  No Self-injurious Behavior: No Danger to Others: No Duty to Warn:no Physical Aggression / Violence:No  Access to Firearms a concern: No  Gang Involvement:No   Subjective: Met with patient via virtual session. She shared she is  doing well overall. She explained she  has started volunteering at the animal shelter which is good. She went on to share she is excited because she is starting a new job and working more hours. She is feeling being able to have more hours will help her have more social contact. She shared that she is struggling with having too much time on her hands so the new job will be a good thing for her. She shared that she was happy because she did this all on her own. She and her husband are doing well. She is concerned she may have allergies to pollen. She shared she has made a female friend online that lives in [place]. Discussed making sure she is careful and not sending him any money. She shared that her husband is aware of the situation and she has made sure the other fellow is aware that she is married.  Reviewed treatment plan and goals in session.  From that reminded her appropriate boundaries and the importance of making sure she is utilizing those.  Patient was encouraged to think through how she can have appropriate boundaries and knows who to trust when she starts her new job.  Patient was able to think through what was most important to her and what would show her someone that she is working with may be a good friend for her.  Patient reported feeling positive about plans.She has lost weight which she has enjoyed. She went on to share that she has noticed when she sits for a long  time she starts having numbness. Encouraged her to talk with her provider about the issue.  Patient agreed that at this point since she is making progress and seemed to be doing very well she would be on an as needed basis for treatment.  Interventions: Solution-Oriented/Positive Psychology and Insight-Oriented  Diagnosis:   ICD-10-CM   1. Bipolar affective disorder in remission [facility])  F31.70       Plan:  Patient is to practice CBT and coping skills to help decrease mood issues and increase self-esteem.  Patient is to work on  finding opportunities to have more social contact including talking to her husband about going to the gym, looking into churches, and starting volunteer services.  Patient is to keep her brain engaged in positive activity doing puzzles or working on different projects around the house.  Patient is  to continue listening to podcast by Dr. De Burrs.  She is to continue to walk to release negative emotions appropriately.  She is to walk with her dog and see if she can meet other neighbors.  Patient is to work on her perspective as she makes decisions about relationships.    Stevphen Meuse, [facility]

## 2024-01-06 NOTE — Telephone Encounter (Signed)
 Pended clonazepam to [facility] on [address]

## 2024-01-14 ENCOUNTER — Encounter: Payer: Self-pay | Admitting: Neurology

## 2024-01-14 ENCOUNTER — Ambulatory Visit: Admitting: Neurology

## 2024-01-14 VITALS — BP 149/109 | HR 80 | Ht 61.0 in | Wt 141.0 lb

## 2024-01-14 DIAGNOSIS — R29898 Other symptoms and signs involving the musculoskeletal system: Secondary | ICD-10-CM

## 2024-01-14 DIAGNOSIS — M5416 Radiculopathy, lumbar region: Secondary | ICD-10-CM | POA: Diagnosis not present

## 2024-01-14 DIAGNOSIS — R200 Anesthesia of skin: Secondary | ICD-10-CM | POA: Diagnosis not present

## 2024-01-14 DIAGNOSIS — R208 Other disturbances of skin sensation: Secondary | ICD-10-CM | POA: Diagnosis not present

## 2024-01-14 MED ORDER — METHYLPREDNISOLONE 4 MG PO TABS: ORAL_TABLET | ORAL | 0 refills | Status: AC

## 2024-01-14 MED ORDER — GABAPENTIN 300 MG PO CAPS: 300.0000 mg | ORAL_CAPSULE | Freq: Three times a day (TID) | ORAL | 5 refills | Status: DC

## 2024-01-14 NOTE — Progress Notes (Signed)
 [facility]  PATIENT: Chloe Dennis DOB: [birth year]  REFERRING DOCTOR OR PCP:  Gildardo Cranker, MD SOURCE: Patient, imaging and lab reports, MRI images personally reviewed.  _________________________________   HISTORICAL  CHIEF COMPLAINT:  Chief Complaint  Patient presents with   New Patient (Initial Visit)    Pt in room 10. Alone.New patient for progressive paresthesias in bilateral legs. Pt symptoms have been going on for about 1 month. Pt has pins and needles from butt down to feet. Reports no numbness in feet.     HISTORY OF PRESENT ILLNESS:  I had the pleasure of seeing patient, Chloe Dennis, at [facility] for neurologic consultation regarding her dysesthesias.  She is a 63 year old woman who gets a pins/needles sensation from her buttocks to her feet.   She does not have these dysesthesias if she stands but they occur after sitting a while or after sitting a while and then standing back up.  She notes the painful tingling but has good sensation still.     She denies weakness.  No change in her bladder.  She has no recent imaging.  However, I did look at the studies from 2017 and she has typical disc and bony degenerative change but no spinal stenosis or nerve root compression.  Recently she had vitamin B12 and hemoglobin A1c checked and they were both normal or noncontributory   Imaging: MRI of the cervical spine 09/01/2016 showed a normal spinal cord and cervicomedullary junction.  There was no spinal stenosis noted.  Facet hypertrophy is noted at C3-C4 to the right with some reactive changes.  Mild disc bulges and uncovertebral spurring at C5-C6 and C6-C7 but no spinal stenosis or nerve root compression.  Thyroid nodules were noted.  MRI of the lumbar spine 09/01/2016 shows disc protrusion at L3-L4 and mild disc bulge and facet hypertrophy at L4L5.  However, there was no spinal stenosis or nerve root compression.  REVIEW OF  SYSTEMS: Constitutional: No fevers, chills, sweats, or change in appetite Eyes: No visual changes, double vision, eye pain Ear, nose and throat: No hearing loss, ear pain, nasal congestion, sore throat Cardiovascular: No chest pain, palpitations Respiratory:  No shortness of breath at rest or with exertion.   No wheezes GastrointestinaI: No nausea, vomiting, diarrhea, abdominal pain, fecal incontinence Genitourinary:  No dysuria, urinary retention or frequency.  No nocturia. Musculoskeletal: See above Integumentary: No rash, pruritus, skin lesions Neurological: as above Psychiatric: She is on Lamictal for bipolar disease Endocrine: No palpitations, diaphoresis, change in appetite, change in weigh or increased thirst Hematologic/Lymphatic:  No anemia, purpura, petechiae. Allergic/Immunologic: No itchy/runny eyes, nasal congestion, recent allergic reactions, rashes  ALLERGIES: Allergies  Allergen Reactions   Other     Other reaction(s): hives   Penicillin G     Other reaction(s): Unknown   Penicillins Hives    whelps Other reaction(s): hives Other reaction(s): hives Other reaction(s): hives   Simvastatin     Other reaction(s): Myalgias    HOME MEDICATIONS:  Current Outpatient Medications:    buPROPion (WELLBUTRIN XL) 300 MG 24 hr tablet, Take 1 tablet (300 mg total) by mouth daily., Disp: 90 tablet, Rfl: 3   clonazePAM (KLONOPIN) 0.5 MG tablet, TAKE ONE TO TWO TABLETS AT BEDTIME AS NEEDED FOR SLEEP., Disp: 60 tablet, Rfl: 0   gabapentin (NEURONTIN) 300 MG capsule, Take 1 capsule (300 mg total) by mouth 3 (three) times daily., Disp: 90 capsule, Rfl: 5   lamoTRIgine (LAMICTAL) 25 MG tablet, Take two  tablets at bedtime., Disp: 60 tablet, Rfl: 5   methylPREDNISolone (MEDROL) 4 MG tablet, Taper from 6 pills po for one day to 1 pill po the last day over 6 days, Disp: 21 tablet, Rfl: 0   perphenazine (TRILAFON) 4 MG tablet, Take one and 1/2 tablets at bedtime., Disp: 135 tablet, Rfl:  3   traZODone (DESYREL) 150 MG tablet, Take 1 tablet (150 mg total) by mouth at bedtime., Disp: 90 tablet, Rfl: 3   valACYclovir (VALTREX) 1000 MG tablet, Take 1,000 mg by mouth daily., Disp: , Rfl:    valsartan (DIOVAN) 160 MG tablet, Take 160 mg by mouth daily., Disp: , Rfl:    lisinopril (ZESTRIL) 20 MG tablet, Take 20 mg by mouth daily., Disp: , Rfl:    simvastatin (ZOCOR) 20 MG tablet, Take 20 mg by mouth at bedtime., Disp: , Rfl:    valACYclovir (VALTREX) 500 MG tablet, Take by mouth., Disp: , Rfl:   PAST MEDICAL HISTORY: Past Medical History:  Diagnosis Date   Anemia    03/2011   Anxiety    no meds   Blood transfusion    2012 in [place]   Depression    no meds, bipolar no meds    PAST SURGICAL HISTORY: Past Surgical History:  Procedure Laterality Date   ABDOMINAL HYSTERECTOMY  09/05/2011   Procedure: HYSTERECTOMY ABDOMINAL;  Surgeon: Geryl Rankins, MD;  Location: [facility];  Service: Gynecology;  Laterality: N/A;   BREAST BIOPSY Left    CESAREAN SECTION  1988   x 1   colonscopy  08/2011   UPPER GASTROINTESTINAL ENDOSCOPY  08/2011    FAMILY HISTORY: Family History  Problem Relation Age of Onset   Breast cancer Sister 53   Schizophrenia Sister     SOCIAL HISTORY: Social History   Socioeconomic History   Marital status: Married    Spouse name: Not on file   Number of children: Not on file   Years of education: Not on file   Highest education level: Not on file  Occupational History   Not on file  Tobacco Use   Smoking status: Never   Smokeless tobacco: Never  Vaping Use   Vaping status: Never Used  Substance and Sexual Activity   Alcohol use: No   Drug use: No   Sexual activity: Yes    Partners: Male    Birth control/protection: None    Comment: married  Other Topics Concern   Not on file  Social History Narrative   Not on file   Social Drivers of Health   Financial Resource Strain: Not on file  Food Insecurity: Not on file  Transportation  Needs: Not on file  Physical Activity: Not on file  Stress: Not on file  Social Connections: Not on file  Intimate Partner Violence: Not on file       PHYSICAL EXAM  Vitals:   01/14/24 0917  BP: (!) 149/109  Pulse: 80  Weight: 141 lb (64 kg)  Height: 5\' 1"  (1.549 m)    Body mass index is 26.64 kg/m.   General: The patient is well-developed and well-nourished and in no acute distress  HEENT:  Head is [place]/AT.  Sclera are anicteric.  Funduscopic exam shows normal optic discs and retinal vessels.  Neck: No carotid bruits are noted.  The neck is nontender.  Cardiovascular: The heart has a regular rate and rhythm with a normal S1 and S2. There were no murmurs, gallops or rubs.    Skin: Extremities  are without rash or  edema.  Musculoskeletal:  Back is nontender  Neurologic Exam  Mental status: The patient is alert and oriented x 3 at the time of the examination. The patient has apparent normal recent and remote memory, with an apparently normal attention span and concentration ability.   Speech is normal.  Cranial nerves: Extraocular movements are full. . There is good facial sensation to soft touch bilaterally.Facial strength is normal.  Trapezius and sternocleidomastoid strength is normal. No dysarthria is noted.  No obvious hearing deficits are noted.  Motor:  Muscle bulk is normal.   Tone is normal. Strength is  5 / 5 in all 4 extremities except 4/5 toe extensors/EHL on right.  5/5 left foots.   Sensory: Sensory testing is intact to pinprick, soft touch and vibration sensation in the arms and left leg but reduced sensation top of foot (L5 dermatome worse than S1) on right.    Coordination: Cerebellar testing reveals good finger-nose-finger and heel-to-shin bilaterally.  Gait and station: Station is normal.   Gait is normal. Tandem gait is normal. No foot drop noted.   Romberg is negative.   Reflexes: Deep tendon reflexes are symmetric and normal in arms and 3 at knees  and ankles.   Plantar responses are flexor.    DIAGNOSTIC DATA (LABS, IMAGING, TESTING) - I reviewed patient records, labs, notes, testing and imaging myself where available.  Lab Results  Component Value Date   WBC 11.7 (H) 09/06/2011   HGB 11.0 (L) 09/06/2011   HCT 32.4 (L) 09/06/2011   MCV 93.4 09/06/2011   PLT 256 09/06/2011      Component Value Date/Time   NA 139 06/30/2011 1440   K 4.4 06/30/2011 1440   CL 101 06/30/2011 1440   CO2 29 06/30/2011 1440   GLUCOSE 79 06/30/2011 1440   BUN 12 06/30/2011 1440   CREATININE 0.84 06/30/2011 1440   CALCIUM 10.4 06/30/2011 1440   PROT 7.6 06/30/2011 1440   ALBUMIN 3.8 06/30/2011 1440   AST 21 06/30/2011 1440   ALT 19 06/30/2011 1440   ALKPHOS 53 06/30/2011 1440   BILITOT 0.4 06/30/2011 1440   GFRNONAA >60 06/30/2011 1440   GFRAA >60 06/30/2011 1440   Lab Results  Component Value Date   CHOL 157 12/23/2018   HDL 52 12/23/2018   LDLCALC 82 12/23/2018   TRIG 134 12/23/2018   CHOLHDL 3.0 12/23/2018   Lab Results  Component Value Date   HGBA1C 5.2 08/15/2018   Lab Results  Component Value Date   VITAMINB12 1779 (H) 03/16/2011   Lab Results  Component Value Date   TSH 0.868 01/03/2011       ASSESSMENT AND PLAN  Dysesthesia - Plan: MR LUMBAR SPINE WO CONTRAST  Leg numbness - Plan: MR LUMBAR SPINE WO CONTRAST  Right leg weakness - Plan: MR LUMBAR SPINE WO CONTRAST  Lumbar radiculopathy - Plan: MR LUMBAR SPINE WO CONTRAST   In summary, Ms. Mannor is a 63 year old woman with dysesthesias.  On exam, she is noted to have mild weakness and numbness in the right foot mostly in the L5 distribution.  To help with her pain, I added gabapentin and she will go up to 300 g 3 times daily.  She is already on low-dose lamotrigine for her mood issues and we discussed that that medication may also help dysesthesias I would consider having an increase higher to 50 mg twice daily and then 100 mg twice daily depending on  response.  A steroid pack will also be provided as it may offer some benefit.  We will check an MRI of the lumbar spine to further evaluate as she appears to have an L5 radiculopathy on the right.  She also had tenderness over the left piriformis muscle and perhaps some of the pain on the left could be due to a piriformis syndrome which definitely could lead to dysesthesias with prolonged sitting.  I advised her to do piriformis stretch exercises.  Based on her response to medication and the results of the medication, further follow-up can be arranged.  In the meantime, she is advised to call us if she has significant new or worsening neurologic symptoms.   Sai Zinn A. Epimenio Foot, MD, [facility] 01/14/2024, 11:29 AM Certified in Neurology, Clinical Neurophysiology, Sleep Medicine and Neuroimaging  [facility] [address] [place], [place] [address] [phone]

## 2024-01-16 ENCOUNTER — Telehealth: Payer: Self-pay | Admitting: Neurology

## 2024-01-16 NOTE — Telephone Encounter (Signed)
 sent to GI they obtain [place] [facility] [phone]

## 2024-01-23 ENCOUNTER — Other Ambulatory Visit: Payer: Self-pay | Admitting: Nurse Practitioner

## 2024-01-23 DIAGNOSIS — Z1231 Encounter for screening mammogram for malignant neoplasm of breast: Secondary | ICD-10-CM

## 2024-01-26 ENCOUNTER — Other Ambulatory Visit

## 2024-01-27 ENCOUNTER — Telehealth: Payer: 59 | Admitting: Adult Health

## 2024-01-27 ENCOUNTER — Encounter: Payer: Self-pay | Admitting: Adult Health

## 2024-01-27 DIAGNOSIS — G4700 Insomnia, unspecified: Secondary | ICD-10-CM

## 2024-01-27 DIAGNOSIS — F411 Generalized anxiety disorder: Secondary | ICD-10-CM

## 2024-01-27 DIAGNOSIS — F3170 Bipolar disorder, currently in remission, most recent episode unspecified: Secondary | ICD-10-CM | POA: Diagnosis not present

## 2024-01-27 DIAGNOSIS — F410 Panic disorder [episodic paroxysmal anxiety] without agoraphobia: Secondary | ICD-10-CM | POA: Diagnosis not present

## 2024-01-27 MED ORDER — CLONAZEPAM 0.5 MG PO TABS: ORAL_TABLET | ORAL | 2 refills | Status: DC

## 2024-01-27 NOTE — Progress Notes (Signed)
 Chloe Dennis [id number] February 05, 1961 63 y.o.  Virtual Visit via Video Note  I connected with pt @ on 01/27/24 at 10:30 AM EDT by a video enabled telemedicine application and verified that I am speaking with the correct person using two identifiers.   I discussed the limitations of evaluation and management by telemedicine and the availability of in person appointments. The patient expressed understanding and agreed to proceed.  I discussed the assessment and treatment plan with the patient. The patient was provided an opportunity to ask questions and all were answered. The patient agreed with the plan and demonstrated an understanding of the instructions.   The patient was advised to call back or seek an in-person evaluation if the symptoms worsen or if the condition fails to improve as anticipated.  I provided 25 minutes of non-face-to-face time during this encounter.  The patient was located at home.  The provider was located at [facility].   Dorothyann Gibbs, NP   Subjective:   Patient ID:  Chloe Dennis is a 63 y.o. (DOB 12/20/1960) female.  Chief Complaint: No chief complaint on file.   HPI Chloe Dennis presents for follow-up of BPD, panic attacks, GAD and insomnia.  Describes mood today as "ok". Pleasant. Mood symptoms - denies depression, anxiety, and irritability. Denies panic attacks. Reports some worry, rumination and over thinking. Mood is stable. Stating "I feel like I'm doing alright". Feels like medications are helpful - taking consistently. Energy levels stable. Active, does not have a regular exercise routine.  Enjoys some usual interests and activities. Married. Lives with husband. Has a son from first marriage - lives in [place].   Appetite adequate. Weight fluctuates - currently 141 pounds. Reports sleeping better some nights than others. Averages 6 to 7 hours. Focus and concentration stable. Completing tasks. Managing aspects of  household. Starting a new job at [employer]. Denies SI or HI.  Denies AH or VH. Denies self harm. Denies substance use.  Review of Systems:  Review of Systems  Musculoskeletal:  Negative for gait problem.  Neurological:  Negative for tremors.  Psychiatric/Behavioral:         Please refer to HPI    Medications: I have reviewed the patient's current medications.  Current Outpatient Medications  Medication Sig Dispense Refill   buPROPion (WELLBUTRIN XL) 300 MG 24 hr tablet Take 1 tablet (300 mg total) by mouth daily. 90 tablet 3   clonazePAM (KLONOPIN) 0.5 MG tablet TAKE ONE TO TWO TABLETS AT BEDTIME AS NEEDED FOR SLEEP. 60 tablet 2   gabapentin (NEURONTIN) 300 MG capsule Take 1 capsule (300 mg total) by mouth 3 (three) times daily. 90 capsule 5   lamoTRIgine (LAMICTAL) 25 MG tablet Take two tablets at bedtime. 60 tablet 5   methylPREDNISolone (MEDROL) 4 MG tablet Taper from 6 pills po for one day to 1 pill po the last day over 6 days 21 tablet 0   perphenazine (TRILAFON) 4 MG tablet Take one and 1/2 tablets at bedtime. 135 tablet 3   traZODone (DESYREL) 150 MG tablet Take 1 tablet (150 mg total) by mouth at bedtime. 90 tablet 3   valACYclovir (VALTREX) 1000 MG tablet Take 1,000 mg by mouth daily.     valsartan (DIOVAN) 160 MG tablet Take 160 mg by mouth daily.     No current facility-administered medications for this visit.    Medication Side Effects: None  Allergies:  Allergies  Allergen Reactions   Other  Other reaction(s): hives   Penicillin G     Other reaction(s): Unknown   Penicillins Hives    whelps Other reaction(s): hives Other reaction(s): hives Other reaction(s): hives   Simvastatin     Other reaction(s): Myalgias    Past Medical History:  Diagnosis Date   Anemia    03/2011   Anxiety    no meds   Blood transfusion    2012 in [place]   Depression    no meds, bipolar no meds    Family History  Problem Relation Age of Onset   Breast  cancer Sister 45   Schizophrenia Sister     Social History   Socioeconomic History   Marital status: Married    Spouse name: Not on file   Number of children: Not on file   Years of education: Not on file   Highest education level: Not on file  Occupational History   Not on file  Tobacco Use   Smoking status: Never   Smokeless tobacco: Never  Vaping Use   Vaping status: Never Used  Substance and Sexual Activity   Alcohol use: No   Drug use: No   Sexual activity: Yes    Partners: Male    Birth control/protection: None    Comment: married  Other Topics Concern   Not on file  Social History Narrative   Not on file   Social Drivers of Health   Financial Resource Strain: Not on file  Food Insecurity: Not on file  Transportation Needs: Not on file  Physical Activity: Not on file  Stress: Not on file  Social Connections: Not on file  Intimate Partner Violence: Not on file    Past Medical History, Surgical history, Social history, and Family history were reviewed and updated as appropriate.   Please see review of systems for further details on the patient's review from today.   Objective:   Physical Exam:  LMP 08/09/2011   Physical Exam Constitutional:      General: She is not in acute distress. Musculoskeletal:        General: No deformity.  Neurological:     Mental Status: She is alert and oriented to person, place, and time.     Coordination: Coordination normal.  Psychiatric:        Attention and Perception: Attention and perception normal. She does not perceive auditory or visual hallucinations.        Mood and Affect: Affect is not labile, blunt, angry or inappropriate.        Speech: Speech normal.        Behavior: Behavior normal.        Thought Content: Thought content normal. Thought content is not paranoid or delusional. Thought content does not include homicidal or suicidal ideation. Thought content does not include homicidal or suicidal plan.         Cognition and Memory: Cognition and memory normal.        Judgment: Judgment normal.     Comments: Insight intact     Lab Review:     Component Value Date/Time   NA 139 06/30/2011 1440   K 4.4 06/30/2011 1440   CL 101 06/30/2011 1440   CO2 29 06/30/2011 1440   GLUCOSE 79 06/30/2011 1440   BUN 12 06/30/2011 1440   CREATININE 0.84 06/30/2011 1440   CALCIUM 10.4 06/30/2011 1440   PROT 7.6 06/30/2011 1440   ALBUMIN 3.8 06/30/2011 1440   AST 21 06/30/2011 1440  ALT 19 06/30/2011 1440   ALKPHOS 53 06/30/2011 1440   BILITOT 0.4 06/30/2011 1440   GFRNONAA >60 06/30/2011 1440   GFRAA >60 06/30/2011 1440       Component Value Date/Time   WBC 11.7 (H) 09/06/2011 0545   RBC 3.47 (L) 09/06/2011 0545   HGB 11.0 (L) 09/06/2011 0545   HGB 13.0 05/15/2011 0854   HCT 32.4 (L) 09/06/2011 0545   HCT 37.3 05/15/2011 0854   PLT 256 09/06/2011 0545   PLT 257 05/15/2011 0854   MCV 93.4 09/06/2011 0545   MCV 86 05/15/2011 0854   MCH 31.7 09/06/2011 0545   MCHC 34.0 09/06/2011 0545   RDW 11.8 09/06/2011 0545   RDW 15.1 05/15/2011 0854   LYMPHSABS 1.0 06/30/2011 1440   LYMPHSABS 1.1 05/15/2011 0854   MONOABS 0.7 06/30/2011 1440   EOSABS 0.1 06/30/2011 1440   EOSABS 0.1 05/15/2011 0854   BASOSABS 0.0 06/30/2011 1440   BASOSABS 0.0 05/15/2011 0854    No results found for: "POCLITH", "LITHIUM"   No results found for: "PHENYTOIN", "PHENOBARB", "VALPROATE", "CBMZ"   .res Assessment: Plan:    Plan:  Trilafon 6mg  at hs - taking one and 1/2 tablet daily of the 4mg   Lamictal 50mg  at hs. Wellbutrin XL 300mg  every morning Trazadone 150mg  at hs - taking 1/2 tablet at bedtime Clonazepam 0.5mg  - 2 tablets at bedtime.   Also taking Gabapentin 300mg  TID through neurology.  Supplements:  MVI Magnesium Fish oil Melatonin B12  Sees Stevphen Meuse for therapy as needed.  RTC 3 months  15 minutes spent dedicated to the care of this patient on the date of this encounter to include  pre-visit review of records, ordering of medication, post visit documentation, and face-to-face time with the patient discussing BPD, panic attacks, GAD and insomnia. Discussed continuing current medication regimen.  Patient advised to contact office with any questions, adverse effects, or acute worsening in signs and symptoms.  Discussed potential metabolic side effects associated with atypical antipsychotics, as well as potential risk for movement side effects. Advised pt to contact office if movement side effects occur.   Discussed potential benefits, risk, and side effects of benzodiazepines to include potential risk of tolerance and dependence, as well as possible drowsiness.  Advised patient not to drive if experiencing drowsiness and to take lowest possible effective dose to minimize risk of dependence and tolerance.   Diagnoses and all orders for this visit:  Bipolar affective disorder in remission (HCC)  Insomnia, unspecified type -     clonazePAM (KLONOPIN) 0.5 MG tablet; TAKE ONE TO TWO TABLETS AT BEDTIME AS NEEDED FOR SLEEP.  Generalized anxiety disorder  Panic attacks     Please see After Visit Summary for patient specific instructions.  Future Appointments  Date Time Provider Department Center  03/02/2024 10:20 AM [facility] MM 2 [facility]  05/14/2024 12:00 PM [facility] DX DEXA 1 [facility]    No orders of the defined types were placed in this encounter.     -------------------------------

## 2024-01-30 ENCOUNTER — Encounter: Payer: Self-pay | Admitting: Neurology

## 2024-01-30 ENCOUNTER — Telehealth: Payer: Self-pay | Admitting: Neurology

## 2024-01-30 MED ORDER — GABAPENTIN 300 MG PO CAPS: 300.0000 mg | ORAL_CAPSULE | Freq: Three times a day (TID) | ORAL | 1 refills | Status: DC

## 2024-01-30 NOTE — Telephone Encounter (Signed)
 Pt called stating that her insurance comp sent her a letter stating that they can only refill her medication gabapentin (NEURONTIN) 300 MG capsule 2 more times because they need her gabapentin (NEURONTIN) 300 MG capsule to be a 90 day supply. Please advise.

## 2024-02-03 ENCOUNTER — Ambulatory Visit: Payer: 59 | Admitting: Adult Health

## 2024-02-11 ENCOUNTER — Encounter: Payer: Self-pay | Admitting: Neurology

## 2024-02-14 ENCOUNTER — Other Ambulatory Visit: Payer: Self-pay | Admitting: Nurse Practitioner

## 2024-02-14 DIAGNOSIS — N6452 Nipple discharge: Secondary | ICD-10-CM

## 2024-03-02 ENCOUNTER — Ambulatory Visit

## 2024-03-11 ENCOUNTER — Ambulatory Visit
Admission: RE | Admit: 2024-03-11 | Discharge: 2024-03-11 | Disposition: A | Discharge status: Home / Self Care | Source: Ambulatory Visit | Attending: Nurse Practitioner | Admitting: Nurse Practitioner

## 2024-03-11 ENCOUNTER — Other Ambulatory Visit: Payer: Self-pay | Admitting: Nurse Practitioner

## 2024-03-11 DIAGNOSIS — N6452 Nipple discharge: Secondary | ICD-10-CM

## 2024-03-11 DIAGNOSIS — N6310 Unspecified lump in the right breast, unspecified quadrant: Secondary | ICD-10-CM

## 2024-03-18 ENCOUNTER — Ambulatory Visit
Admission: RE | Admit: 2024-03-18 | Discharge: 2024-03-18 | Disposition: A | Discharge status: Home / Self Care | Source: Ambulatory Visit | Attending: Nurse Practitioner | Admitting: Nurse Practitioner

## 2024-03-18 DIAGNOSIS — N6310 Unspecified lump in the right breast, unspecified quadrant: Secondary | ICD-10-CM

## 2024-03-18 HISTORY — PX: BREAST BIOPSY: SHX20

## 2024-03-19 LAB — SURGICAL PATHOLOGY

## 2024-04-21 ENCOUNTER — Encounter: Payer: Self-pay | Admitting: Neurology

## 2024-04-22 ENCOUNTER — Other Ambulatory Visit: Payer: Self-pay | Admitting: *Deleted

## 2024-04-22 ENCOUNTER — Telehealth: Payer: Self-pay

## 2024-04-22 DIAGNOSIS — F3170 Bipolar disorder, currently in remission, most recent episode unspecified: Secondary | ICD-10-CM

## 2024-04-22 MED ORDER — LAMOTRIGINE 25 MG PO TABS
50.0000 mg | ORAL_TABLET | Freq: Two times a day (BID) | ORAL | 5 refills | Status: DC
Start: 2024-04-22 — End: 2024-08-25

## 2024-04-27 ENCOUNTER — Encounter: Payer: Self-pay | Admitting: Adult Health

## 2024-04-27 ENCOUNTER — Telehealth: Admitting: Adult Health

## 2024-04-27 DIAGNOSIS — F3170 Bipolar disorder, currently in remission, most recent episode unspecified: Secondary | ICD-10-CM

## 2024-04-27 DIAGNOSIS — G4700 Insomnia, unspecified: Secondary | ICD-10-CM

## 2024-04-27 DIAGNOSIS — F411 Generalized anxiety disorder: Secondary | ICD-10-CM | POA: Diagnosis not present

## 2024-04-27 DIAGNOSIS — F410 Panic disorder [episodic paroxysmal anxiety] without agoraphobia: Secondary | ICD-10-CM

## 2024-04-27 MED ORDER — CLONAZEPAM 0.5 MG PO TABS: ORAL_TABLET | ORAL | 2 refills | Status: DC

## 2024-04-27 NOTE — Progress Notes (Signed)
 Chloe Dennis [id number] 1961/03/11 63 y.o.  Virtual Visit via Video Note  I connected with pt @ on 04/27/24 at 10:30 AM EDT by a video enabled telemedicine application and verified that I am speaking with the correct person using two identifiers.   I discussed the limitations of evaluation and management by telemedicine and the availability of in person appointments. The patient expressed understanding and agreed to proceed.  I discussed the assessment and treatment plan with the patient. The patient was provided an opportunity to ask questions and all were answered. The patient agreed with the plan and demonstrated an understanding of the instructions.   The patient was advised to call back or seek an in-person evaluation if the symptoms worsen or if the condition fails to improve as anticipated.  I provided 25 minutes of non-face-to-face time during this encounter.  The patient was located at home.  The provider was located at [facility].   Angeline LOISE Sayers, NP   Subjective:   Patient ID:  Chloe Dennis is a 63 y.o. (DOB 1961-02-08) female.  Chief Complaint: No chief complaint on file.   HPI Joely Losier Popwell presents for follow-up of BPD, panic attacks, GAD and insomnia.  Describes mood today as ok. Pleasant. Mood symptoms - reports some depression while working at [employer] - getting a little bit better. Denies anxiety and irritability. Denies panic attacks. Reports some worry, rumination and over thinking - sometimes. Reports some obsessive thoughts. Reports mood is pretty stable. Stating I feel like I'm doing alright. Feels like medications are helpful - taking consistently. Energy levels lower - feeling tired. Active, does not have a regular exercise routine.  Enjoys some usual interests and activities. Married. Lives with husband. Has a son from first marriage - lives in [place] .   Appetite adequate. Weight fluctuates - currently 141  pounds. Reports sleeping better some nights than others - not the greatest. Averages 6 hours. Reports some daytime napping. Reports focus and concentration is stable. Completing tasks. Managing aspects of household. Working at [employer] (20 hours) - quit [employer]. Denies SI or HI.  Denies AH or VH. Denies self harm. Denies substance use.  Review of Systems:  Review of Systems  Musculoskeletal:  Negative for gait problem.  Neurological:  Negative for tremors.  Psychiatric/Behavioral:         Please refer to HPI    Medications: I have reviewed the patient's current medications.  Current Outpatient Medications  Medication Sig Dispense Refill   buPROPion  (WELLBUTRIN  XL) 300 MG 24 hr tablet Take 1 tablet (300 mg total) by mouth daily. 90 tablet 3   clonazePAM  (KLONOPIN ) 0.5 MG tablet TAKE ONE TO TWO TABLETS AT BEDTIME AS NEEDED FOR SLEEP. 60 tablet 2   gabapentin  (NEURONTIN ) 300 MG capsule Take 1 capsule (300 mg total) by mouth 3 (three) times daily. 270 capsule 1   lamoTRIgine  (LAMICTAL ) 25 MG tablet Take 2 tablets (50 mg total) by mouth 2 (two) times daily. 120 tablet 5   methylPREDNISolone  (MEDROL ) 4 MG tablet Taper from 6 pills po for one day to 1 pill po the last day over 6 days 21 tablet 0   perphenazine  (TRILAFON ) 4 MG tablet Take one and 1/2 tablets at bedtime. 135 tablet 3   traZODone  (DESYREL ) 150 MG tablet Take 1 tablet (150 mg total) by mouth at bedtime. 90 tablet 3   valACYclovir (VALTREX) 1000 MG tablet Take 1,000 mg by mouth daily.     valsartan (DIOVAN)  160 MG tablet Take 160 mg by mouth daily.     No current facility-administered medications for this visit.    Medication Side Effects: None  Allergies:  Allergies  Allergen Reactions   Other     Other reaction(s): hives   Penicillin G     Other reaction(s): Unknown   Penicillins Hives    whelps Other reaction(s): hives Other reaction(s): hives Other reaction(s): hives   Simvastatin     Other  reaction(s): Myalgias    Past Medical History:  Diagnosis Date   Anemia    03/2011   Anxiety    no meds   Blood transfusion    2012 in [place]   Depression    no meds, bipolar no meds    Family History  Problem Relation Age of Onset   Breast cancer Sister 31   Schizophrenia Sister     Social History   Socioeconomic History   Marital status: Married    Spouse name: Not on file   Number of children: Not on file   Years of education: Not on file   Highest education level: Not on file  Occupational History   Not on file  Tobacco Use   Smoking status: Never   Smokeless tobacco: Never  Vaping Use   Vaping status: Never Used  Substance and Sexual Activity   Alcohol use: No   Drug use: No   Sexual activity: Yes    Partners: Male    Birth control/protection: None    Comment: married  Other Topics Concern   Not on file  Social History Narrative   Not on file   Social Drivers of Health   Financial Resource Strain: Not on file  Food Insecurity: Not on file  Transportation Needs: Not on file  Physical Activity: Not on file  Stress: Not on file  Social Connections: Not on file  Intimate Partner Violence: Not on file    Past Medical History, Surgical history, Social history, and Family history were reviewed and updated as appropriate.   Please see review of systems for further details on the patient's review from today.   Objective:   Physical Exam:  LMP 08/09/2011   Physical Exam Constitutional:      General: She is not in acute distress. Musculoskeletal:        General: No deformity.  Neurological:     Mental Status: She is alert and oriented to person, place, and time.     Coordination: Coordination normal.  Psychiatric:        Attention and Perception: Attention and perception normal. She does not perceive auditory or visual hallucinations.        Mood and Affect: Mood normal. Mood is not anxious or depressed. Affect is not labile, blunt, angry or  inappropriate.        Speech: Speech normal.        Behavior: Behavior normal.        Thought Content: Thought content normal. Thought content is not paranoid or delusional. Thought content does not include homicidal or suicidal ideation. Thought content does not include homicidal or suicidal plan.        Cognition and Memory: Cognition and memory normal.        Judgment: Judgment normal.     Comments: Insight intact     Lab Review:     Component Value Date/Time   NA 139 06/30/2011 1440   K 4.4 06/30/2011 1440   CL 101 06/30/2011 1440  CO2 29 06/30/2011 1440   GLUCOSE 79 06/30/2011 1440   BUN 12 06/30/2011 1440   CREATININE 0.84 06/30/2011 1440   CALCIUM 10.4 06/30/2011 1440   PROT 7.6 06/30/2011 1440   ALBUMIN 3.8 06/30/2011 1440   AST 21 06/30/2011 1440   ALT 19 06/30/2011 1440   ALKPHOS 53 06/30/2011 1440   BILITOT 0.4 06/30/2011 1440   GFRNONAA >60 06/30/2011 1440   GFRAA >60 06/30/2011 1440       Component Value Date/Time   WBC 11.7 (H) 09/06/2011 0545   RBC 3.47 (L) 09/06/2011 0545   HGB 11.0 (L) 09/06/2011 0545   HGB 13.0 05/15/2011 0854   HCT 32.4 (L) 09/06/2011 0545   HCT 37.3 05/15/2011 0854   PLT 256 09/06/2011 0545   PLT 257 05/15/2011 0854   MCV 93.4 09/06/2011 0545   MCV 86 05/15/2011 0854   MCH 31.7 09/06/2011 0545   MCHC 34.0 09/06/2011 0545   RDW 11.8 09/06/2011 0545   RDW 15.1 05/15/2011 0854   LYMPHSABS 1.0 06/30/2011 1440   LYMPHSABS 1.1 05/15/2011 0854   MONOABS 0.7 06/30/2011 1440   EOSABS 0.1 06/30/2011 1440   EOSABS 0.1 05/15/2011 0854   BASOSABS 0.0 06/30/2011 1440   BASOSABS 0.0 05/15/2011 0854    No results found for: POCLITH, LITHIUM    No results found for: PHENYTOIN, PHENOBARB, VALPROATE, CBMZ   .res Assessment: Plan:    Plan:  Trilafon  6mg  at hs - taking one and 1/2 tablet daily of the 4mg   Lamictal  50mg  at hs. Wellbutrin  XL 300mg  every morning Trazadone 150mg  at hs - taking 1/2 tablet at  bedtime Clonazepam  0.5mg  - 2 tablets at bedtime.   Also taking Gabapentin  300mg  TID through neurology.  Supplements:  MVI Magnesium Fish oil Melatonin B12  Sees Holly Ingram for therapy as needed.  RTC 3 months  25 minutes spent dedicated to the care of this patient on the date of this encounter to include pre-visit review of records, ordering of medication, post visit documentation, and face-to-face time with the patient discussing BPD, panic attacks, GAD and insomnia. Discussed continuing current medication regimen.  Patient advised to contact office with any questions, adverse effects, or acute worsening in signs and symptoms.  Discussed potential metabolic side effects associated with atypical antipsychotics, as well as potential risk for movement side effects. Advised pt to contact office if movement side effects occur.   Discussed potential benefits, risk, and side effects of benzodiazepines to include potential risk of tolerance and dependence, as well as possible drowsiness.  Advised patient not to drive if experiencing drowsiness and to take lowest possible effective dose to minimize risk of dependence and tolerance.  There are no diagnoses linked to this encounter.   Please see After Visit Summary for patient specific instructions.  Future Appointments  Date Time Provider Department Center  04/27/2024 10:30 AM Levita Monical Nattalie, NP [facility] None  05/14/2024 12:00 PM [facility]    No orders of the defined types were placed in this encounter.     -------------------------------

## 2024-04-30 ENCOUNTER — Telehealth: Payer: Self-pay | Admitting: Adult Health

## 2024-04-30 NOTE — Telephone Encounter (Signed)
 Pt called in saying she is depressed, crying a lot, and not herself. The Wellbutrin  is not doing anything. She's not sure if she is going through normal bipolar depression and wants to speak with the nurse about it.

## 2024-05-01 ENCOUNTER — Other Ambulatory Visit: Payer: Self-pay

## 2024-05-01 MED ORDER — SERTRALINE HCL 50 MG PO TABS: 50.0000 mg | ORAL_TABLET | Freq: Every day | ORAL | 0 refills | Status: DC

## 2024-05-01 NOTE — Telephone Encounter (Signed)
 Rx for Zoloft  50 mg sent per request.

## 2024-05-01 NOTE — Telephone Encounter (Signed)
 Please call patient to schedule an earlier appt per [place]. Please leave current appt for now.

## 2024-05-01 NOTE — Telephone Encounter (Signed)
 Please see message.

## 2024-05-01 NOTE — Telephone Encounter (Signed)
 Pt called still waiting for a nurse to call her back about her depression. Please call [phone]

## 2024-05-01 NOTE — Telephone Encounter (Signed)
 Pt was seen last week. Said she has been lying to herself on how she is doing and did not give good info to you. She is crying a lot, depression 9/10. She has some anxiety because she said she can't concentrate at work but didn't rate it. She recently changed jobs and thought things would be better but they aren't. She is just walking around at work, not getting anything done because she can't focus. She is going to work. She can get to sleep, can't stay asleep, getting approximately 5 hrs. No SI.   Neuro just increased Lamictal  to 50 mg BID because gabapentin  wasn't helping with pins and needles sensation.   She doesn't feel like the Wellbutrin  is helpful. Reports being on Zoloft  previously and felt it helped. It was discontinued at 04/19/22 visit.

## 2024-05-01 NOTE — Telephone Encounter (Signed)
 The earliest appointment  Chloe Dennis has is 05/11/24. Chloe Dennis said that she can't wait that long. She said that in the past she was on zoloft  and that helped. She would like to know if Chloe Dennis can send that in so she can take something to help her until her appointment.

## 2024-05-01 NOTE — Telephone Encounter (Signed)
 The soonest appt gina has is 05/11/24. She can't wait that long. She said that she has tried zoloft  in the past and it worked. I have put her on a cancellation list as well. She wants to know if gina will give zoloft  until she is seen

## 2024-05-12 ENCOUNTER — Telehealth: Admitting: Adult Health

## 2024-05-12 ENCOUNTER — Encounter: Payer: Self-pay | Admitting: Adult Health

## 2024-05-12 DIAGNOSIS — G4700 Insomnia, unspecified: Secondary | ICD-10-CM

## 2024-05-12 DIAGNOSIS — F410 Panic disorder [episodic paroxysmal anxiety] without agoraphobia: Secondary | ICD-10-CM | POA: Diagnosis not present

## 2024-05-12 DIAGNOSIS — F3170 Bipolar disorder, currently in remission, most recent episode unspecified: Secondary | ICD-10-CM

## 2024-05-12 DIAGNOSIS — F411 Generalized anxiety disorder: Secondary | ICD-10-CM | POA: Diagnosis not present

## 2024-05-12 MED ORDER — TRAZODONE HCL 150 MG PO TABS: 150.0000 mg | ORAL_TABLET | Freq: Every day | ORAL | 0 refills | Status: DC

## 2024-05-12 NOTE — Progress Notes (Signed)
 Chloe Dennis [id number] 10-07-1961 63 y.o.  Virtual Visit via Video Note  I connected with pt @ on 05/12/24 at  9:00 AM EDT by a video enabled telemedicine application and verified that I am speaking with the correct person using two identifiers.   I discussed the limitations of evaluation and management by telemedicine and the availability of in person appointments. The patient expressed understanding and agreed to proceed.  I discussed the assessment and treatment plan with the patient. The patient was provided an opportunity to ask questions and all were answered. The patient agreed with the plan and demonstrated an understanding of the instructions.   The patient was advised to call back or seek an in-person evaluation if the symptoms worsen or if the condition fails to improve as anticipated.  I provided 25 minutes of non-face-to-face time during this encounter.  The patient was located at home.  The provider was located at [facility].   Chloe LOISE Sayers, NP   Subjective:   Patient ID:  Chloe Dennis is a 63 y.o. (DOB 1961/01/04) female.  Chief Complaint: No chief complaint on file.   HPI Chloe Dennis presents for follow-up of  BPD, panic attacks, GAD and insomnia.  Describes mood today as better. Pleasant. Mood symptoms - reports decreased depression, anxiety and irritability with adding the Zoloft  50mg  daily. Denies panic attacks. Denies worry, rumination and over thinking. Denies obsessive thoughts. Reports mood has improved on the up swing.  Stating I'm feeling more normal. Feels like the addition of Zoloft  has been  helpful for mood instability. Taking medications as prescribed.  Energy levels improved. Active, does not have a regular exercise routine.  Enjoys some usual interests and activities. Married. Lives with husband. Has a son from first marriage - lives in [place] .   Appetite adequate. Weight fluctuates - currently 141  pounds. Reports sleeping better some nights than others. Averages 6 to 7 hours. Reports some daytime napping. Reports focus and concentration is stable. Completing tasks. Managing aspects of household. Working at [employer] (20 hours). Denies SI or HI.  Denies AH or VH. Denies self harm. Denies substance use.  Review of Systems:  Review of Systems  Musculoskeletal:  Negative for gait problem.  Neurological:  Negative for tremors.  Psychiatric/Behavioral:         Please refer to HPI    Medications: I have reviewed the patient's current medications.  Current Outpatient Medications  Medication Sig Dispense Refill   buPROPion  (WELLBUTRIN  XL) 300 MG 24 hr tablet Take 1 tablet (300 mg total) by mouth daily. 90 tablet 3   clonazePAM  (KLONOPIN ) 0.5 MG tablet TAKE ONE TO TWO TABLETS AT BEDTIME AS NEEDED FOR SLEEP. 60 tablet 2   gabapentin  (NEURONTIN ) 300 MG capsule Take 1 capsule (300 mg total) by mouth 3 (three) times daily. 270 capsule 1   lamoTRIgine  (LAMICTAL ) 25 MG tablet Take 2 tablets (50 mg total) by mouth 2 (two) times daily. 120 tablet 5   methylPREDNISolone  (MEDROL ) 4 MG tablet Taper from 6 pills po for one day to 1 pill po the last day over 6 days 21 tablet 0   perphenazine  (TRILAFON ) 4 MG tablet Take one and 1/2 tablets at bedtime. 135 tablet 3   sertraline  (ZOLOFT ) 50 MG tablet Take 1 tablet (50 mg total) by mouth daily. 30 tablet 0   traZODone  (DESYREL ) 150 MG tablet Take 1 tablet (150 mg total) by mouth at bedtime. 90 tablet 3   valACYclovir (VALTREX) 1000  MG tablet Take 1,000 mg by mouth daily.     valsartan (DIOVAN) 160 MG tablet Take 160 mg by mouth daily.     No current facility-administered medications for this visit.    Medication Side Effects: None  Allergies:  Allergies  Allergen Reactions   Other     Other reaction(s): hives   Penicillin G     Other reaction(s): Unknown   Penicillins Hives    whelps Other reaction(s): hives Other reaction(s):  hives Other reaction(s): hives   Simvastatin     Other reaction(s): Myalgias    Past Medical History:  Diagnosis Date   Anemia    03/2011   Anxiety    no meds   Blood transfusion    2012 in [place]   Depression    no meds, bipolar no meds    Family History  Problem Relation Age of Onset   Breast cancer Sister 85   Schizophrenia Sister     Social History   Socioeconomic History   Marital status: Married    Spouse name: Not on file   Number of children: Not on file   Years of education: Not on file   Highest education level: Not on file  Occupational History   Not on file  Tobacco Use   Smoking status: Never   Smokeless tobacco: Never  Vaping Use   Vaping status: Never Used  Substance and Sexual Activity   Alcohol use: No   Drug use: No   Sexual activity: Yes    Partners: Male    Birth control/protection: None    Comment: married  Other Topics Concern   Not on file  Social History Narrative   Not on file   Social Drivers of Health   Financial Resource Strain: Not on file  Food Insecurity: Not on file  Transportation Needs: Not on file  Physical Activity: Not on file  Stress: Not on file  Social Connections: Not on file  Intimate Partner Violence: Not on file    Past Medical History, Surgical history, Social history, and Family history were reviewed and updated as appropriate.   Please see review of systems for further details on the patient's review from today.   Objective:   Physical Exam:  LMP 08/09/2011   Physical Exam  Lab Review:     Component Value Date/Time   NA 139 06/30/2011 1440   K 4.4 06/30/2011 1440   CL 101 06/30/2011 1440   CO2 29 06/30/2011 1440   GLUCOSE 79 06/30/2011 1440   BUN 12 06/30/2011 1440   CREATININE 0.84 06/30/2011 1440   CALCIUM 10.4 06/30/2011 1440   PROT 7.6 06/30/2011 1440   ALBUMIN 3.8 06/30/2011 1440   AST 21 06/30/2011 1440   ALT 19 06/30/2011 1440   ALKPHOS 53 06/30/2011 1440   BILITOT 0.4  06/30/2011 1440   GFRNONAA >60 06/30/2011 1440   GFRAA >60 06/30/2011 1440       Component Value Date/Time   WBC 11.7 (H) 09/06/2011 0545   RBC 3.47 (L) 09/06/2011 0545   HGB 11.0 (L) 09/06/2011 0545   HGB 13.0 05/15/2011 0854   HCT 32.4 (L) 09/06/2011 0545   HCT 37.3 05/15/2011 0854   PLT 256 09/06/2011 0545   PLT 257 05/15/2011 0854   MCV 93.4 09/06/2011 0545   MCV 86 05/15/2011 0854   MCH 31.7 09/06/2011 0545   MCHC 34.0 09/06/2011 0545   RDW 11.8 09/06/2011 0545   RDW 15.1 05/15/2011 0854  LYMPHSABS 1.0 06/30/2011 1440   LYMPHSABS 1.1 05/15/2011 0854   MONOABS 0.7 06/30/2011 1440   EOSABS 0.1 06/30/2011 1440   EOSABS 0.1 05/15/2011 0854   BASOSABS 0.0 06/30/2011 1440   BASOSABS 0.0 05/15/2011 0854    No results found for: POCLITH, LITHIUM    No results found for: PHENYTOIN, PHENOBARB, VALPROATE, CBMZ   .res Assessment: Plan:    Plan:  Trilafon  6mg  at hs - taking one and 1/2 tablet daily of the 4mg   Lamictal  50mg  at hs. Wellbutrin  XL 300mg  every morning Trazadone 150mg  at hs - taking 1/2 tablet at bedtime Clonazepam  0.5mg  - 2 tablets at bedtime.   Added Zoloft  50mg  between appointments  Also taking Gabapentin  300mg  TID through neurology.  Supplements:  MVI Magnesium Fish oil Melatonin B12  Sees Holly Ingram for therapy as needed.  RTC 3 months  25 minutes spent dedicated to the care of this patient on the date of this encounter to include pre-visit review of records, ordering of medication, post visit documentation, and face-to-face time with the patient discussing BPD, panic attacks, GAD and insomnia. Discussed continuing current medication regimen.  Patient advised to contact office with any questions, adverse effects, or acute worsening in signs and symptoms.  Discussed potential metabolic side effects associated with atypical antipsychotics, as well as potential risk for movement side effects. Advised pt to contact office if movement  side effects occur.   Discussed potential benefits, risk, and side effects of benzodiazepines to include potential risk of tolerance and dependence, as well as possible drowsiness.  Advised patient not to drive if experiencing drowsiness and to take lowest possible effective dose to minimize risk of dependence and tolerance.   Diagnoses and all orders for this visit:  Bipolar affective disorder in remission (HCC)  Generalized anxiety disorder  Insomnia, unspecified type  Panic attacks     Please see After Visit Summary for patient specific instructions.  Future Appointments  Date Time Provider Department Center  05/14/2024 12:00 PM [facility]  05/18/2024 11:00 AM Gail Castilla, [facility] CP-CP None  07/28/2024  9:00 AM Ajeenah Heiny Nattalie, NP CP-CP None    No orders of the defined types were placed in this encounter.     -------------------------------

## 2024-05-14 ENCOUNTER — Ambulatory Visit (HOSPITAL_BASED_OUTPATIENT_CLINIC_OR_DEPARTMENT_OTHER)
Admission: RE | Admit: 2024-05-14 | Discharge: 2024-05-14 | Disposition: A | Discharge status: Home / Self Care | Source: Ambulatory Visit | Attending: Endocrinology | Admitting: Endocrinology

## 2024-05-14 ENCOUNTER — Other Ambulatory Visit: Payer: Managed Care, Other (non HMO)

## 2024-05-14 DIAGNOSIS — M8580 Other specified disorders of bone density and structure, unspecified site: Secondary | ICD-10-CM | POA: Diagnosis present

## 2024-05-18 ENCOUNTER — Ambulatory Visit: Admitting: Psychiatry

## 2024-05-18 DIAGNOSIS — F3170 Bipolar disorder, currently in remission, most recent episode unspecified: Secondary | ICD-10-CM

## 2024-05-18 NOTE — Progress Notes (Signed)
 [facility] Counselor/Therapist Progress Note  Patient ID: Chloe Dennis, MRN: [id number],    Date: 05/18/2024  Time Spent: 51 minutes start time 11:04 AM end time 11:55 AM Virtual Visit via Video Note Connected with patient by a telemedicine/telehealth application, with their informed consent, and verified patient privacy and that I am speaking with the correct person using two identifiers. I discussed the limitations, risks, security and privacy concerns of performing psychotherapy and the availability of in person appointments. I also discussed with the patient that there may be a patient responsible charge related to this service. The patient expressed understanding and agreed to proceed. I discussed the treatment planning with the patient. The patient was provided an opportunity to ask questions and all were answered. The patient agreed with the plan and demonstrated an understanding of the instructions. The patient was advised to call  our office if  symptoms worsen or feel they are in a crisis state and need immediate contact.   Therapist Location: home Patient Location: home    Treatment Type: Individual Therapy  Reported Symptoms: anxiety, sadness, triggered responses, fatigue, rumination, grief issues  Mental Status Exam:  Appearance:   Casual     Behavior:  Appropriate  Motor:  Normal  Speech/Language:   Normal Rate  Affect:  Appropriate  Mood:  anxious  Thought process:  normal  Thought content:    WNL  Sensory/Perceptual disturbances:    WNL  Orientation:  oriented to person, place, time/date, and situation  Attention:  Good  Concentration:  Good  Memory:  WNL  Fund of knowledge:   Good  Insight:    Good  Judgment:   Good  Impulse Control:  Good   Risk Assessment: Danger to Self:  No Self-injurious Behavior: No Danger to Others: No Duty to Warn:no Physical Aggression / Violence:No  Access to Firearms a concern: No  Gang Involvement:No    Subjective: Met with patient via virtual session. She shared she quit her job at [employer] due to working so many hours. She shared she went back to [employer] and it was a hard transition due to having to learn a new register. She shared she was feeling depressed and was having crying spells. She had her provider put her on some new mediation and that has helped. She went on to share she had a breast biopsy and it triggered emotions for her. She shared she never felt comfortable there so she went to her old boss and went back to her old job. She shared she is happier at [employer]. She went on to share that her husband is having a scan on a gland on Friday and that is creating anxiety for her. She shared that it is impacting their sex drive. She is concerned that he may have cancer due to having multiple in her family having cancer and has lost 5 members of her family. 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 She was able to see that job change, biopsy, and her husband's health issue triggered her depression.Discussed importance of paying attention to her mood and taking time to grieve the people that she lost. Encouraged her to do some journaling or write letters to the ones she has lost. She shared she did talk to others and found that to be helpful. Encouraged her to remember that the social does make a difference and she needs to continue reaching out to others to keep  her mood better. Spending issues, sleep issues.She is trying to make some friends at work especially since she is working at [employer]. Encouraged patient to work on dealing with the grief of her past family.  Interventions: Solution-Oriented/Positive Psychology and Insight-Oriented  Diagnosis:   ICD-10-CM   1. Bipolar affective disorder in remission [facility])  F31.70       Plan:  Patient is to practice CBT and coping skills to help decrease mood issues and increase self-esteem.   Patient is to work on finding opportunities to have more social contact including talking to her husband about going to the gym, looking into churches, and starting volunteer services.  Patient is to keep her brain engaged in positive activity doing puzzles or working on different projects around the house.  Patient is  to continue listening to podcast by Dr. Aleck Favorite.  She is to continue to walk to release negative emotions appropriately.  She is to walk with her dog and see if she can meet other neighbors.  Patient is to work on her perspective as she makes decisions about relationships.  She went on to share she was having some spending issues but she has medical bills that are taking her money so she is doing better.     Silvano Pacini, [facility]

## 2024-05-24 ENCOUNTER — Other Ambulatory Visit: Payer: Self-pay | Admitting: Adult Health

## 2024-06-15 ENCOUNTER — Encounter: Payer: Self-pay | Admitting: Adult Health

## 2024-06-15 ENCOUNTER — Telehealth (INDEPENDENT_AMBULATORY_CARE_PROVIDER_SITE_OTHER): Admitting: Adult Health

## 2024-06-15 DIAGNOSIS — F410 Panic disorder [episodic paroxysmal anxiety] without agoraphobia: Secondary | ICD-10-CM | POA: Diagnosis not present

## 2024-06-15 DIAGNOSIS — G4700 Insomnia, unspecified: Secondary | ICD-10-CM

## 2024-06-15 DIAGNOSIS — F319 Bipolar disorder, unspecified: Secondary | ICD-10-CM | POA: Diagnosis not present

## 2024-06-15 DIAGNOSIS — F411 Generalized anxiety disorder: Secondary | ICD-10-CM | POA: Diagnosis not present

## 2024-06-15 DIAGNOSIS — F3170 Bipolar disorder, currently in remission, most recent episode unspecified: Secondary | ICD-10-CM

## 2024-06-15 MED ORDER — SERTRALINE HCL 50 MG PO TABS: 50.0000 mg | ORAL_TABLET | Freq: Every day | ORAL | 1 refills | Status: DC

## 2024-06-15 MED ORDER — LAMOTRIGINE 25 MG PO TABS: ORAL_TABLET | ORAL | 1 refills | Status: DC

## 2024-06-15 NOTE — Progress Notes (Signed)
 Chloe Dennis [id number] 03-09-1961 63 y.o.  Virtual Visit via Video Note  I connected with pt @ on 06/15/24 at 10:30 AM EDT by a video enabled telemedicine application and verified that I am speaking with the correct person using two identifiers.   I discussed the limitations of evaluation and management by telemedicine and the availability of in person appointments. The patient expressed understanding and agreed to proceed.  I discussed the assessment and treatment plan with the patient. The patient was provided an opportunity to ask questions and all were answered. The patient agreed with the plan and demonstrated an understanding of the instructions.   The patient was advised to call back or seek an in-person evaluation if the symptoms worsen or if the condition fails to improve as anticipated.  I provided 25 minutes of non-face-to-face time during this encounter.  The patient was located at home.  The provider was located at [facility].   Angeline LOISE Sayers, NP   Subjective:   Patient ID:  Chloe Dennis is a 63 y.o. (DOB [birth year]) female.  Chief Complaint: No chief complaint on file.   HPI Chloe Dennis presents for follow-up of  BPD, panic attacks, GAD and insomnia.  Describes mood today as better. Pleasant. Denies tearfulness. Mood symptoms - denies depression, anxiety and irritability - the addition of Zoloft  50mg  daily has really helped. Denies panic attacks. Denies worry, rumination and over thinking. Denies obsessive thoughts. Reports mood is stable. Stating I feel like I'm pretty stable - I feel like me again. Taking medications as prescribed.  Energy levels improved. Active, does not have a regular exercise routine.  Enjoys some usual interests and activities. Married. Lives with husband. Has a son from first marriage - lives in [place] .   Appetite adequate. Weight fluctuates - currently 141 pounds. Reports sleeping better some  nights than others. Averages 6 to 7 hours. Denies daytime napping. Reports focus and concentration is stable. Completing tasks. Managing aspects of household. Working at [employer] (20 hours). Denies SI or HI.  Denies AH or VH. Denies self harm. Denies substance use.   Review of Systems:  Review of Systems  Musculoskeletal:  Negative for gait problem.  Neurological:  Negative for tremors.  Psychiatric/Behavioral:         Please refer to HPI    Medications: I have reviewed the patient's current medications.  Current Outpatient Medications  Medication Sig Dispense Refill   buPROPion  (WELLBUTRIN  XL) 300 MG 24 hr tablet Take 1 tablet (300 mg total) by mouth daily. 90 tablet 3   clonazePAM  (KLONOPIN ) 0.5 MG tablet TAKE ONE TO TWO TABLETS AT BEDTIME AS NEEDED FOR SLEEP. 60 tablet 2   gabapentin  (NEURONTIN ) 300 MG capsule Take 1 capsule (300 mg total) by mouth 3 (three) times daily. 270 capsule 1   lamoTRIgine  (LAMICTAL ) 25 MG tablet Take 2 tablets (50 mg total) by mouth 2 (two) times daily. 120 tablet 5   methylPREDNISolone  (MEDROL ) 4 MG tablet Taper from 6 pills po for one day to 1 pill po the last day over 6 days 21 tablet 0   perphenazine  (TRILAFON ) 4 MG tablet Take one and 1/2 tablets at bedtime. 135 tablet 3   sertraline  (ZOLOFT ) 50 MG tablet TAKE 1 TABLET BY MOUTH EVERY DAY 30 tablet 0   traZODone  (DESYREL ) 150 MG tablet Take 1 tablet (150 mg total) by mouth at bedtime. 90 tablet 0   valACYclovir (VALTREX) 1000 MG tablet Take 1,000 mg by mouth daily.  valsartan (DIOVAN) 160 MG tablet Take 160 mg by mouth daily.     No current facility-administered medications for this visit.    Medication Side Effects: None  Allergies:  Allergies  Allergen Reactions   Other     Other reaction(s): hives   Penicillin G     Other reaction(s): Unknown   Penicillins Hives    whelps Other reaction(s): hives Other reaction(s): hives Other reaction(s): hives   Simvastatin     Other  reaction(s): Myalgias    Past Medical History:  Diagnosis Date   Anemia    03/2011   Anxiety    no meds   Blood transfusion    2012 in [place]   Depression    no meds, bipolar no meds    Family History  Problem Relation Age of Onset   Breast cancer Sister 16   Schizophrenia Sister     Social History   Socioeconomic History   Marital status: Married    Spouse name: Not on file   Number of children: Not on file   Years of education: Not on file   Highest education level: Not on file  Occupational History   Not on file  Tobacco Use   Smoking status: Never   Smokeless tobacco: Never  Vaping Use   Vaping status: Never Used  Substance and Sexual Activity   Alcohol use: No   Drug use: No   Sexual activity: Yes    Partners: Male    Birth control/protection: None    Comment: married  Other Topics Concern   Not on file  Social History Narrative   Not on file   Social Drivers of Health   Financial Resource Strain: Not on file  Food Insecurity: Not on file  Transportation Needs: Not on file  Physical Activity: Not on file  Stress: Not on file  Social Connections: Not on file  Intimate Partner Violence: Not on file    Past Medical History, Surgical history, Social history, and Family history were reviewed and updated as appropriate.   Please see review of systems for further details on the patient's review from today.   Objective:   Physical Exam:  LMP 08/09/2011   Physical Exam Constitutional:      General: She is not in acute distress. Musculoskeletal:        General: No deformity.  Neurological:     Mental Status: She is alert and oriented to person, place, and time.     Coordination: Coordination normal.  Psychiatric:        Attention and Perception: Attention and perception normal. She does not perceive auditory or visual hallucinations.        Mood and Affect: Mood normal. Mood is not anxious or depressed. Affect is not labile, blunt, angry or  inappropriate.        Speech: Speech normal.        Behavior: Behavior normal.        Thought Content: Thought content normal. Thought content is not paranoid or delusional. Thought content does not include homicidal or suicidal ideation. Thought content does not include homicidal or suicidal plan.        Cognition and Memory: Cognition and memory normal.        Judgment: Judgment normal.     Comments: Insight intact     Lab Review:     Component Value Date/Time   NA 139 06/30/2011 1440   K 4.4 06/30/2011 1440   CL 101 06/30/2011  1440   CO2 29 06/30/2011 1440   GLUCOSE 79 06/30/2011 1440   BUN 12 06/30/2011 1440   CREATININE 0.84 06/30/2011 1440   CALCIUM 10.4 06/30/2011 1440   PROT 7.6 06/30/2011 1440   ALBUMIN 3.8 06/30/2011 1440   AST 21 06/30/2011 1440   ALT 19 06/30/2011 1440   ALKPHOS 53 06/30/2011 1440   BILITOT 0.4 06/30/2011 1440   GFRNONAA >60 06/30/2011 1440   GFRAA >60 06/30/2011 1440       Component Value Date/Time   WBC 11.7 (H) 09/06/2011 0545   RBC 3.47 (L) 09/06/2011 0545   HGB 11.0 (L) 09/06/2011 0545   HGB 13.0 05/15/2011 0854   HCT 32.4 (L) 09/06/2011 0545   HCT 37.3 05/15/2011 0854   PLT 256 09/06/2011 0545   PLT 257 05/15/2011 0854   MCV 93.4 09/06/2011 0545   MCV 86 05/15/2011 0854   MCH 31.7 09/06/2011 0545   MCHC 34.0 09/06/2011 0545   RDW 11.8 09/06/2011 0545   RDW 15.1 05/15/2011 0854   LYMPHSABS 1.0 06/30/2011 1440   LYMPHSABS 1.1 05/15/2011 0854   MONOABS 0.7 06/30/2011 1440   EOSABS 0.1 06/30/2011 1440   EOSABS 0.1 05/15/2011 0854   BASOSABS 0.0 06/30/2011 1440   BASOSABS 0.0 05/15/2011 0854    No results found for: POCLITH, LITHIUM    No results found for: PHENYTOIN, PHENOBARB, VALPROATE, CBMZ   .res Assessment: Plan:    Plan:  Trilafon  6mg  at hs - taking one and 1/2 tablet daily of the 4mg   Wellbutrin  XL 300mg  every morning Trazadone 150mg  at hs - taking 1/2 tablet at bedtime Clonazepam  0.5mg  - 2 tablets at  bedtime.  Zoloft  50mg  daily Lamictal  50mg  at hs   Also taking Gabapentin  300mg  TID and Lamictal  50mg  BID through neurology.  Supplements:  MVI Magnesium Fish oil Melatonin B12  Sees Holly Ingram for therapy as needed.  RTC 3 months  25 minutes spent dedicated to the care of this patient on the date of this encounter to include pre-visit review of records, ordering of medication, post visit documentation, and face-to-face time with the patient discussing BPD, panic attacks, GAD and insomnia. Discussed continuing current medication regimen.  Patient advised to contact office with any questions, adverse effects, or acute worsening in signs and symptoms.  Discussed potential metabolic side effects associated with atypical antipsychotics, as well as potential risk for movement side effects. Advised pt to contact office if movement side effects occur.   Discussed potential benefits, risk, and side effects of benzodiazepines to include potential risk of tolerance and dependence, as well as possible drowsiness.  Advised patient not to drive if experiencing drowsiness and to take lowest possible effective dose to minimize risk of dependence and tolerance.   There are no diagnoses linked to this encounter.   Please see After Visit Summary for patient specific instructions.  Future Appointments  Date Time Provider Department Center  06/15/2024 10:30 AM Eutha Cude Nattalie, NP CP-CP None  06/16/2024 10:00 AM Gail Castilla, [facility] CP-CP None  07/28/2024  9:00 AM Kailon Treese Nattalie, NP CP-CP None    No orders of the defined types were placed in this encounter.     -------------------------------

## 2024-06-16 ENCOUNTER — Ambulatory Visit (INDEPENDENT_AMBULATORY_CARE_PROVIDER_SITE_OTHER): Admitting: Psychiatry

## 2024-06-16 DIAGNOSIS — F3170 Bipolar disorder, currently in remission, most recent episode unspecified: Secondary | ICD-10-CM

## 2024-06-16 NOTE — Progress Notes (Signed)
 [facility] Counselor/Therapist Progress Note  Patient ID: Chloe Dennis, MRN: [id number],    Date: 06/16/2024  Time Spent: 39 minutes start time 10:05 AM end time 10:44 AM Virtual Visit via Video Note Connected with patient by a telemedicine/telehealth application, with their informed consent, and verified patient privacy and that I am speaking with the correct person using two identifiers. I discussed the limitations, risks, security and privacy concerns of performing psychotherapy and the availability of in person appointments. I also discussed with the patient that there may be a patient responsible charge related to this service. The patient expressed understanding and agreed to proceed. I discussed the treatment planning with the patient. The patient was provided an opportunity to ask questions and all were answered. The patient agreed with the plan and demonstrated an understanding of the instructions. The patient was advised to call  our office if  symptoms worsen or feel they are in a crisis state and need immediate contact.   Therapist Location: home Patient Location: home    Treatment Type: Individual Therapy  Reported Symptoms: anxiety,memory issues  Mental Status Exam:  Appearance:   Casual     Behavior:  Appropriate  Motor:  Normal  Speech/Language:   Normal Rate  Affect:  Appropriate  Mood:  normal  Thought process:  normal  Thought content:    WNL  Sensory/Perceptual disturbances:    WNL  Orientation:  oriented to person, place, time/date, and situation  Attention:  Good  Concentration:  Good  Memory:  WNL  Fund of knowledge:   Good  Insight:    Good  Judgment:   Good  Impulse Control:  Good   Risk Assessment: Danger to Self:  No Self-injurious Behavior: No Danger to Others: No Duty to Warn:no Physical Aggression / Violence:No  Access to Firearms a concern: No  Gang Involvement:No   Subjective: Met with patient via virtual session. She  shared that things are going well and she is feeling more like her. She shared she is going to stay on the Zoloft  because it has helped her.  She shared that her husband's spot was benign so she is feeling better. Work is going well. She shared that working the hours that she is has given her social interaction that she needs and that has helped her mood. She shared that she is at 2 different locations so she is meeting new people. Discussed how she needs to recognize she needs to stay involved with others through work or volunteering or another social group. Challenged her to think through what helps her keep her emotions in check. She was able to see that movement helps her. Keeping her brain engaged in positive things and journaling. She shared she has a journal on her phone that she uses when she needs to get things out of her head and that helps. She is also eating health and staying hydrated. Patient stated that she is having issues with cataracts and that is stressing her. Discussed the issue and how to handle it so she can take care of herself and her eyesight.  Interventions: Solution-Oriented/Positive Psychology  Diagnosis:   ICD-10-CM   1. Bipolar affective disorder in remission [facility])  F31.70       Plan:  Patient is to practice CBT and coping skills to help decrease mood issues and increase self-esteem.  Patient is to work on finding opportunities to have more social contact including talking to her husband about going to the gym,  looking into churches, and starting volunteer services.  Patient is to keep her brain engaged in positive activity doing puzzles or working on different projects around the house.  Patient is  to continue listening to podcast by Dr. Aleck Favorite.  She is to continue to walk to release negative emotions appropriately.  She is to walk with her dog and see if she can meet other neighbors.  Patient will contact office if she needs an appointment  Silvano Pacini,  [facility]

## 2024-06-17 ENCOUNTER — Other Ambulatory Visit: Payer: Self-pay | Admitting: Adult Health

## 2024-06-17 DIAGNOSIS — F3170 Bipolar disorder, currently in remission, most recent episode unspecified: Secondary | ICD-10-CM

## 2024-07-27 ENCOUNTER — Ambulatory Visit
Admission: RE | Admit: 2024-07-27 | Discharge: 2024-07-27 | Disposition: A | Source: Ambulatory Visit | Attending: Endocrinology | Admitting: Endocrinology

## 2024-07-27 DIAGNOSIS — E041 Nontoxic single thyroid nodule: Secondary | ICD-10-CM

## 2024-07-28 ENCOUNTER — Telehealth: Admitting: Adult Health

## 2024-07-31 ENCOUNTER — Other Ambulatory Visit: Payer: Self-pay | Admitting: Neurology

## 2024-07-31 DIAGNOSIS — F3170 Bipolar disorder, currently in remission, most recent episode unspecified: Secondary | ICD-10-CM

## 2024-07-31 NOTE — Telephone Encounter (Signed)
 Pt called to request to speak to Nurse about changing medication  or placing medication on  Therapy override   lamoTRIgine  (LAMICTAL ) 25 MG tablet  , Pt would like to discuss with Nurse . Pt is also getting lamoTRIgine  (LAMICTAL ) 25 MG tabletl from another MD as Well .  Also Pt is requesting medication refill on  gabapentin  (NEURONTIN ) 300 MG capsule    [id number] - [place], [place] - [address] (Ph: [phone])

## 2024-07-31 NOTE — Telephone Encounter (Signed)
 Pt called again and is needing to speak to the RN as soon as possible due to her running out of medication and is needing to discuss being weaned off of it.

## 2024-08-01 ENCOUNTER — Other Ambulatory Visit: Payer: Self-pay | Admitting: Neurology

## 2024-08-03 NOTE — Telephone Encounter (Signed)
 Called patient as she is almost out of lamictal  because of insurance denying form too soon. Dr. Sater prescribes 500 mg twice daily for neuropathy and another provider prescribes 50 mg daily for mood. She is asking if Dr. Sater can prescribe the entire amount as insurance wont cover form 2 providers. Advised I will send to Dr. Vear to review.

## 2024-08-04 ENCOUNTER — Telehealth: Payer: Self-pay

## 2024-08-04 NOTE — Addendum Note (Signed)
 Addended by: JOSHUA IZETTA CROME on: 08/04/2024 07:33 AM   Modules accepted: Orders

## 2024-08-04 NOTE — Telephone Encounter (Signed)
 Good morning,   When we spoke yesterday you said that you were also taking an additional 50 mg that was prescribed by the Psychiatrist. I am happy to call the pharmacy but Dr. Vear is only willing to prescribe what he will manage and that is lamictal  50 mg twice daily. Please have your psychiatrist remove it from their system so it is not continued to be sent in and refilled. The pharmacy doesn't open until later but I will call.

## 2024-08-04 NOTE — Telephone Encounter (Signed)
 Last seen on 01/14/24 No follow up scheduled

## 2024-08-04 NOTE — Telephone Encounter (Signed)
 Call to pharmacy, spoke with Ro, there are no issue with getting the lamicatal filled today. They are getting it ready now. Advised the only active script should from Dr. Vear.

## 2024-08-20 ENCOUNTER — Other Ambulatory Visit: Payer: Self-pay | Admitting: Adult Health

## 2024-08-20 DIAGNOSIS — F411 Generalized anxiety disorder: Secondary | ICD-10-CM

## 2024-08-25 MED ORDER — LAMOTRIGINE 25 MG PO TABS: 50.0000 mg | ORAL_TABLET | Freq: Two times a day (BID) | ORAL | 0 refills | Status: AC

## 2024-08-25 NOTE — Addendum Note (Signed)
 Addended by: Madailein Londo L on: 08/25/2024 08:00 AM   Modules accepted: Orders

## 2024-09-10 ENCOUNTER — Other Ambulatory Visit: Payer: Self-pay | Admitting: Surgery

## 2024-09-10 DIAGNOSIS — D241 Benign neoplasm of right breast: Secondary | ICD-10-CM

## 2024-09-14 ENCOUNTER — Encounter: Payer: Self-pay | Admitting: Adult Health

## 2024-09-14 ENCOUNTER — Telehealth: Admitting: Adult Health

## 2024-09-14 DIAGNOSIS — F410 Panic disorder [episodic paroxysmal anxiety] without agoraphobia: Secondary | ICD-10-CM

## 2024-09-14 DIAGNOSIS — G4700 Insomnia, unspecified: Secondary | ICD-10-CM | POA: Diagnosis not present

## 2024-09-14 DIAGNOSIS — F319 Bipolar disorder, unspecified: Secondary | ICD-10-CM

## 2024-09-14 DIAGNOSIS — F3170 Bipolar disorder, currently in remission, most recent episode unspecified: Secondary | ICD-10-CM

## 2024-09-14 DIAGNOSIS — F411 Generalized anxiety disorder: Secondary | ICD-10-CM | POA: Diagnosis not present

## 2024-09-14 MED ORDER — PERPHENAZINE 4 MG PO TABS: ORAL_TABLET | ORAL | 1 refills | Status: AC

## 2024-09-14 MED ORDER — TRAZODONE HCL 150 MG PO TABS
150.0000 mg | ORAL_TABLET | Freq: Every day | ORAL | 1 refills | Status: AC
Start: 2024-09-14 — End: ?

## 2024-09-14 MED ORDER — CLONAZEPAM 0.5 MG PO TABS: ORAL_TABLET | ORAL | 2 refills | Status: AC

## 2024-09-14 MED ORDER — SERTRALINE HCL 50 MG PO TABS: 50.0000 mg | ORAL_TABLET | Freq: Every day | ORAL | 1 refills | Status: AC

## 2024-09-14 MED ORDER — BUPROPION HCL ER (XL) 300 MG PO TB24: 300.0000 mg | ORAL_TABLET | Freq: Every day | ORAL | 1 refills | Status: DC

## 2024-09-14 NOTE — Progress Notes (Signed)
 Chloe Dennis [id number] August 26, 1961 63 y.o.  Virtual Visit via Video Note  I connected with pt @ on 09/14/24 at 10:00 AM EST by a video enabled telemedicine application and verified that I am speaking with the correct person using two identifiers.   I discussed the limitations of evaluation and management by telemedicine and the availability of in person appointments. The patient expressed understanding and agreed to proceed.  I discussed the assessment and treatment plan with the patient. The patient was provided an opportunity to ask questions and all were answered. The patient agreed with the plan and demonstrated an understanding of the instructions.   The patient was advised to call back or seek an in-person evaluation if the symptoms worsen or if the condition fails to improve as anticipated.  I provided 25 minutes of non-face-to-face time during this encounter.  The patient was located at home.  The provider was located at [facility].   Angeline LOISE Sayers, NP   Subjective:   Patient ID:  Chloe Dennis is a 63 y.o. (DOB Dec 18, 1960) female.  Chief Complaint: No chief complaint on file.   HPI Chloe Dennis presents for follow-up of BPD, panic attacks, GAD and insomnia.  Describes mood today as better. Pleasant. Denies tearfulness. Mood symptoms - denies depression, anxiety and irritability. Denies panic attacks. Reports some worry - finances. Denies rumination and over thinking. Denies obsessive thoughts. Reports mood is mostly stable - gets in a bad mood sometimes. Stating I feel like I'm doing ok overall. Taking medications as prescribed.  Energy levels varies day to day. Active, does not have a regular exercise routine - walking dogs.  Enjoys some usual interests and activities. Married. Lives with husband. Has a son from first marriage - lives in [place] .   Appetite adequate. Weight loss - currently 139 pounds. Reports sleeping better  some nights than others. Averages 6 to 7 or more hours. Denies daytime napping. Reports focus and concentration is stable. Completing tasks. Managing aspects of household. Working at [employer] - part time. 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 Denies SI or HI.  Denies AH or VH. Denies self harm. Denies substance use.  Cataract surgery in January  Review of Systems:  Review of Systems  Musculoskeletal:  Negative for gait problem.  Neurological:  Negative for tremors.  Psychiatric/Behavioral:         Please refer to HPI    Medications: I have reviewed the patient's current medications.  Current Outpatient Medications  Medication Sig Dispense Refill   buPROPion  (WELLBUTRIN  XL) 300 MG 24 hr tablet Take 1 tablet (300 mg total) by mouth daily. 90 tablet 3   clonazePAM  (KLONOPIN ) 0.5 MG tablet TAKE ONE TO TWO TABLETS AT BEDTIME AS NEEDED FOR SLEEP. 60 tablet 0   gabapentin  (NEURONTIN ) 300 MG capsule TAKE 1 CAPSULE BY MOUTH THREE TIMES A DAY 270 capsule 1   lamoTRIgine  (LAMICTAL ) 25 MG tablet Take 2 tablets (50 mg total) by mouth 2 (two) times daily. 360 tablet 0   methylPREDNISolone  (MEDROL ) 4 MG tablet Taper from 6 pills po for one day to 1 pill po the last day over 6 days 21 tablet 0   perphenazine  (TRILAFON ) 4 MG tablet TAKE ONE AND 1/2 TABLETS AT BEDTIME. 135 tablet 0   sertraline  (ZOLOFT ) 50 MG tablet Take 1 tablet (50 mg total) by mouth daily. 90 tablet 1   traZODone  (DESYREL ) 150 MG tablet Take 1 tablet (150 mg total) by mouth at  bedtime. 90 tablet 0   valACYclovir (VALTREX) 1000 MG tablet Take 1,000 mg by mouth daily.     valsartan (DIOVAN) 160 MG tablet Take 160 mg by mouth daily.     No current facility-administered medications for this visit.    Medication Side Effects: None  Allergies:  Allergies  Allergen Reactions   Other     Other reaction(s): hives   Penicillin G     Other reaction(s): Unknown   Penicillins Hives    whelps Other reaction(s):  hives Other reaction(s): hives Other reaction(s): hives   Simvastatin     Other reaction(s): Myalgias    Past Medical History:  Diagnosis Date   Anemia    03/2011   Anxiety    no meds   Blood transfusion    2012 in [place]   Depression    no meds, bipolar no meds    Family History  Problem Relation Age of Onset   Breast cancer Sister 84   Schizophrenia Sister     Social History   Socioeconomic History   Marital status: Married    Spouse name: Not on file   Number of children: Not on file   Years of education: Not on file   Highest education level: Not on file  Occupational History   Not on file  Tobacco Use   Smoking status: Never   Smokeless tobacco: Never  Vaping Use   Vaping status: Never Used  Substance and Sexual Activity   Alcohol use: No   Drug use: No   Sexual activity: Yes    Partners: Male    Birth control/protection: None    Comment: married  Other Topics Concern   Not on file  Social History Narrative   Not on file   Social Drivers of Health   Financial Resource Strain: Not on file  Food Insecurity: Not on file  Transportation Needs: Not on file  Physical Activity: Not on file  Stress: Not on file  Social Connections: Not on file  Intimate Partner Violence: Not on file    Past Medical History, Surgical history, Social history, and Family history were reviewed and updated as appropriate.   Please see review of systems for further details on the patient's review from today.   Objective:   Physical Exam:  LMP 08/09/2011   Physical Exam Constitutional:      General: She is not in acute distress. Musculoskeletal:        General: No deformity.  Neurological:     Mental Status: She is alert and oriented to person, place, and time.     Coordination: Coordination normal.  Psychiatric:        Attention and Perception: Attention and perception normal. She does not perceive auditory or visual hallucinations.        Mood and Affect:  Mood normal. Mood is not anxious or depressed. Affect is not labile, blunt, angry or inappropriate.        Speech: Speech normal.        Behavior: Behavior normal.        Thought Content: Thought content normal. Thought content is not paranoid or delusional. Thought content does not include homicidal or suicidal ideation. Thought content does not include homicidal or suicidal plan.        Cognition and Memory: Cognition and memory normal.        Judgment: Judgment normal.     Comments: Insight intact     Lab Review:  Component Value Date/Time   NA 139 06/30/2011 1440   K 4.4 06/30/2011 1440   CL 101 06/30/2011 1440   CO2 29 06/30/2011 1440   GLUCOSE 79 06/30/2011 1440   BUN 12 06/30/2011 1440   CREATININE 0.84 06/30/2011 1440   CALCIUM 10.4 06/30/2011 1440   PROT 7.6 06/30/2011 1440   ALBUMIN 3.8 06/30/2011 1440   AST 21 06/30/2011 1440   ALT 19 06/30/2011 1440   ALKPHOS 53 06/30/2011 1440   BILITOT 0.4 06/30/2011 1440   GFRNONAA >60 06/30/2011 1440   GFRAA >60 06/30/2011 1440       Component Value Date/Time   WBC 11.7 (H) 09/06/2011 0545   RBC 3.47 (L) 09/06/2011 0545   HGB 11.0 (L) 09/06/2011 0545   HGB 13.0 05/15/2011 0854   HCT 32.4 (L) 09/06/2011 0545   HCT 37.3 05/15/2011 0854   PLT 256 09/06/2011 0545   PLT 257 05/15/2011 0854   MCV 93.4 09/06/2011 0545   MCV 86 05/15/2011 0854   MCH 31.7 09/06/2011 0545   MCHC 34.0 09/06/2011 0545   RDW 11.8 09/06/2011 0545   RDW 15.1 05/15/2011 0854   LYMPHSABS 1.0 06/30/2011 1440   LYMPHSABS 1.1 05/15/2011 0854   MONOABS 0.7 06/30/2011 1440   EOSABS 0.1 06/30/2011 1440   EOSABS 0.1 05/15/2011 0854   BASOSABS 0.0 06/30/2011 1440   BASOSABS 0.0 05/15/2011 0854    No results found for: POCLITH, LITHIUM    No results found for: PHENYTOIN, PHENOBARB, VALPROATE, CBMZ   .res Assessment: Plan:    Plan:  Zoloft  50mg  daily Trilafon  6mg  at hs - taking one and 1/2 tablet daily of the 4mg   Wellbutrin  XL  300mg  every morning Trazadone 150mg  at hs - taking 1/2 tablet at bedtime Clonazepam  0.5mg  - 2 tablets at bedtime - will call in 3 months for next set of refills  Also taking Gabapentin  300mg  TID and Lamictal  50mg  BID through neurology.  Supplements:  MVI Magnesium Fish oil Melatonin B12  Sees Holly Ingram for therapy as needed.  RTC 6 months  25 minutes spent dedicated to the care of this patient on the date of this encounter to include pre-visit review of records, ordering of medication, post visit documentation, and face-to-face time with the patient discussing BPD, panic attacks, GAD and insomnia. Discussed continuing current medication regimen.  Patient advised to contact office with any questions, adverse effects, or acute worsening in signs and symptoms.  Discussed potential metabolic side effects associated with atypical antipsychotics, as well as potential risk for movement side effects. Advised pt to contact office if movement side effects occur.   Discussed potential benefits, risk, and side effects of benzodiazepines to include potential risk of tolerance and dependence, as well as possible drowsiness. Advised patient not to drive if experiencing drowsiness and to take lowest possible effective dose to minimize risk of dependence and tolerance.   There are no diagnoses linked to this encounter.   Please see After Visit Summary for patient specific instructions.  Future Appointments  Date Time Provider Department Center  09/14/2024 10:00 AM Harrold Fitchett, Angeline Mattocks, NP [facility] None  10/12/2024 10:50 AM [facility] DIAG TOMO 2 [facility]  10/12/2024 11:00 AM [facility] US  2 [facility]  01/26/2025 11:00 AM Sater, Charlie LABOR, MD [facility] None    No orders of the defined types were placed in this encounter.     -------------------------------

## 2024-10-06 ENCOUNTER — Encounter: Payer: Self-pay | Admitting: Surgery

## 2024-10-12 ENCOUNTER — Inpatient Hospital Stay: Admission: RE | Admit: 2024-10-12 | Discharge: 2024-10-12 | Attending: Surgery | Admitting: Surgery

## 2024-10-12 ENCOUNTER — Other Ambulatory Visit

## 2024-10-12 DIAGNOSIS — D241 Benign neoplasm of right breast: Secondary | ICD-10-CM

## 2024-10-13 DIAGNOSIS — N6310 Unspecified lump in the right breast, unspecified quadrant: Secondary | ICD-10-CM

## 2024-10-28 ENCOUNTER — Other Ambulatory Visit: Payer: Self-pay | Admitting: Adult Health

## 2024-10-28 DIAGNOSIS — F411 Generalized anxiety disorder: Secondary | ICD-10-CM

## 2025-01-26 ENCOUNTER — Ambulatory Visit: Admitting: Neurology

## 2025-03-16 ENCOUNTER — Telehealth: Admitting: Adult Health

## 2025-04-14 ENCOUNTER — Other Ambulatory Visit

## 2025-04-14 ENCOUNTER — Encounter
